# Patient Record
Sex: Female | Born: 1935 | Race: White | Hispanic: No | Marital: Married | Smoking: Never smoker
Health system: Southern US, Community
[De-identification: ages and names within clinical notes are randomized; demographics above are authoritative.]

## PROBLEM LIST (undated history)

## (undated) DIAGNOSIS — Z923 Personal history of irradiation: Secondary | ICD-10-CM

## (undated) DIAGNOSIS — C50919 Malignant neoplasm of unspecified site of unspecified female breast: Secondary | ICD-10-CM

## (undated) DIAGNOSIS — C55 Malignant neoplasm of uterus, part unspecified: Secondary | ICD-10-CM

## (undated) HISTORY — PX: EXCISION / BIOPSY BREAST / NIPPLE / DUCT: SUR469

---

## 2004-11-02 ENCOUNTER — Ambulatory Visit: Payer: Self-pay | Admitting: Internal Medicine

## 2004-12-24 ENCOUNTER — Ambulatory Visit: Payer: Self-pay | Admitting: Family Medicine

## 2004-12-30 ENCOUNTER — Ambulatory Visit: Payer: Self-pay | Admitting: Family Medicine

## 2006-01-04 ENCOUNTER — Ambulatory Visit: Payer: Self-pay | Admitting: Family Medicine

## 2006-01-11 ENCOUNTER — Ambulatory Visit: Payer: Self-pay | Admitting: Family Medicine

## 2006-07-29 ENCOUNTER — Ambulatory Visit: Payer: Self-pay | Admitting: General Surgery

## 2006-08-09 ENCOUNTER — Ambulatory Visit: Payer: Self-pay | Admitting: General Surgery

## 2007-01-05 ENCOUNTER — Ambulatory Visit: Payer: Self-pay | Admitting: Family Medicine

## 2007-03-21 ENCOUNTER — Other Ambulatory Visit: Payer: Self-pay

## 2007-03-21 ENCOUNTER — Ambulatory Visit: Payer: Self-pay | Admitting: General Surgery

## 2007-03-27 ENCOUNTER — Ambulatory Visit: Payer: Self-pay | Admitting: General Surgery

## 2007-11-22 ENCOUNTER — Ambulatory Visit: Payer: Self-pay | Admitting: Family Medicine

## 2007-12-14 ENCOUNTER — Ambulatory Visit: Payer: Self-pay | Admitting: General Surgery

## 2008-01-09 ENCOUNTER — Ambulatory Visit: Payer: Self-pay | Admitting: Family Medicine

## 2008-09-17 ENCOUNTER — Emergency Department: Payer: Self-pay | Admitting: Emergency Medicine

## 2008-12-06 DIAGNOSIS — C55 Malignant neoplasm of uterus, part unspecified: Secondary | ICD-10-CM

## 2008-12-06 HISTORY — DX: Malignant neoplasm of uterus, part unspecified: C55

## 2009-01-16 ENCOUNTER — Ambulatory Visit: Payer: Self-pay | Admitting: Internal Medicine

## 2009-01-21 ENCOUNTER — Ambulatory Visit: Payer: Self-pay | Admitting: Internal Medicine

## 2009-03-05 ENCOUNTER — Ambulatory Visit: Payer: Self-pay | Admitting: Internal Medicine

## 2009-04-18 ENCOUNTER — Ambulatory Visit: Payer: Self-pay | Admitting: Surgery

## 2009-04-18 ENCOUNTER — Ambulatory Visit: Payer: Self-pay | Admitting: Cardiology

## 2009-04-28 ENCOUNTER — Ambulatory Visit: Payer: Self-pay | Admitting: Surgery

## 2010-01-28 ENCOUNTER — Ambulatory Visit: Payer: Self-pay | Admitting: Internal Medicine

## 2010-02-16 ENCOUNTER — Ambulatory Visit: Payer: Self-pay | Admitting: Gastroenterology

## 2010-02-18 ENCOUNTER — Ambulatory Visit: Payer: Self-pay | Admitting: Gastroenterology

## 2011-02-03 ENCOUNTER — Ambulatory Visit: Payer: Self-pay | Admitting: Internal Medicine

## 2011-02-24 ENCOUNTER — Ambulatory Visit: Payer: Self-pay | Admitting: Internal Medicine

## 2011-12-07 DIAGNOSIS — C50919 Malignant neoplasm of unspecified site of unspecified female breast: Secondary | ICD-10-CM

## 2011-12-07 HISTORY — PX: BREAST LUMPECTOMY: SHX2

## 2011-12-07 HISTORY — PX: BREAST BIOPSY: SHX20

## 2011-12-07 HISTORY — DX: Malignant neoplasm of unspecified site of unspecified female breast: C50.919

## 2012-03-02 ENCOUNTER — Ambulatory Visit: Payer: Self-pay | Admitting: Internal Medicine

## 2012-03-15 ENCOUNTER — Ambulatory Visit: Payer: Self-pay | Admitting: Internal Medicine

## 2012-04-11 ENCOUNTER — Ambulatory Visit: Payer: Self-pay | Admitting: Surgery

## 2012-04-19 LAB — PATHOLOGY REPORT

## 2012-04-25 ENCOUNTER — Ambulatory Visit: Payer: Self-pay | Admitting: Surgery

## 2012-04-25 DIAGNOSIS — I10 Essential (primary) hypertension: Secondary | ICD-10-CM

## 2012-04-25 LAB — CBC
HCT: 42 %
HGB: 13.9 g/dL
MCH: 30.7 pg
MCHC: 33.1 g/dL
MCV: 93 fL
Platelet: 113 10*3/uL — ABNORMAL LOW
RBC: 4.54 X10 6/mm 3
RDW: 13.5 %
WBC: 5.2 10*3/uL

## 2012-05-02 ENCOUNTER — Ambulatory Visit: Payer: Self-pay | Admitting: Surgery

## 2012-05-16 ENCOUNTER — Ambulatory Visit: Payer: Self-pay | Admitting: Internal Medicine

## 2012-06-05 ENCOUNTER — Ambulatory Visit: Payer: Self-pay | Admitting: Internal Medicine

## 2012-06-05 ENCOUNTER — Ambulatory Visit: Payer: Self-pay | Admitting: Surgery

## 2012-06-06 ENCOUNTER — Ambulatory Visit: Payer: Self-pay | Admitting: Internal Medicine

## 2012-07-06 ENCOUNTER — Ambulatory Visit: Payer: Self-pay | Admitting: Internal Medicine

## 2012-08-06 ENCOUNTER — Ambulatory Visit: Payer: Self-pay | Admitting: Internal Medicine

## 2012-08-07 IMAGING — US TRANSABDOMINAL ULTRASOUND OF PELVIS
1 series · 17 of 25 positions shown · non-contrast
Comparison: none

REASON FOR EXAM: Left Adnexal Pain
COMMENTS:

[Series 1: transabdominal ultrasound of pelvis · 17 of 40 slices shown]
[im 1/40]
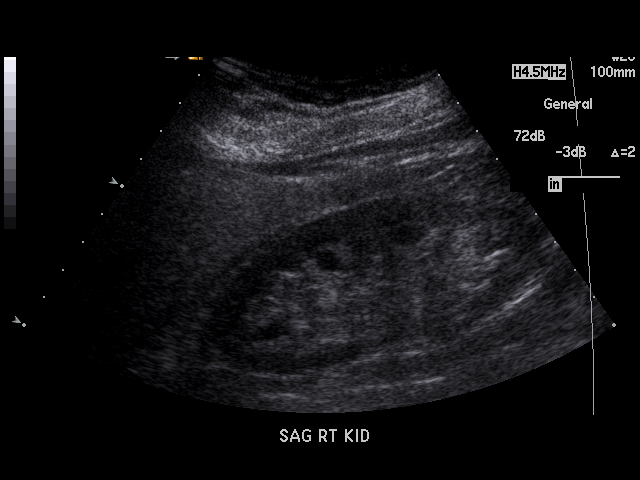
[im 4/40]
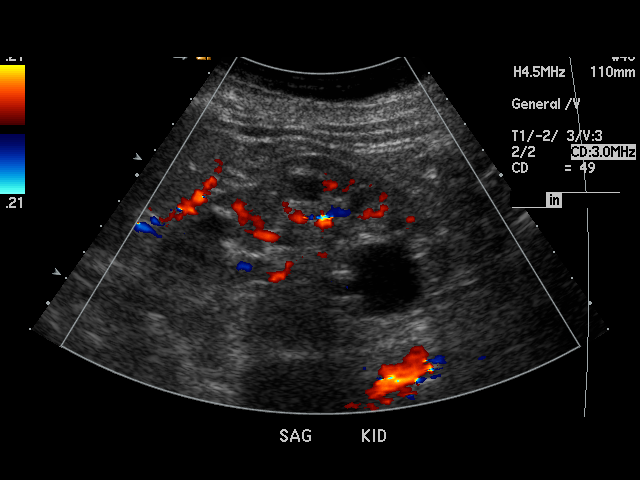
[im 5/40]
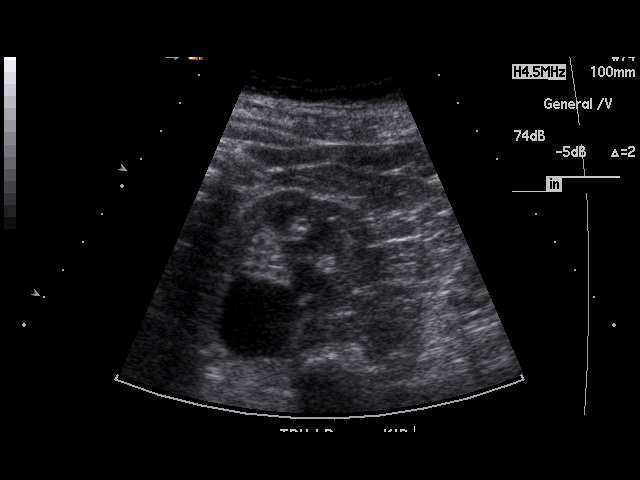
[im 9/40]
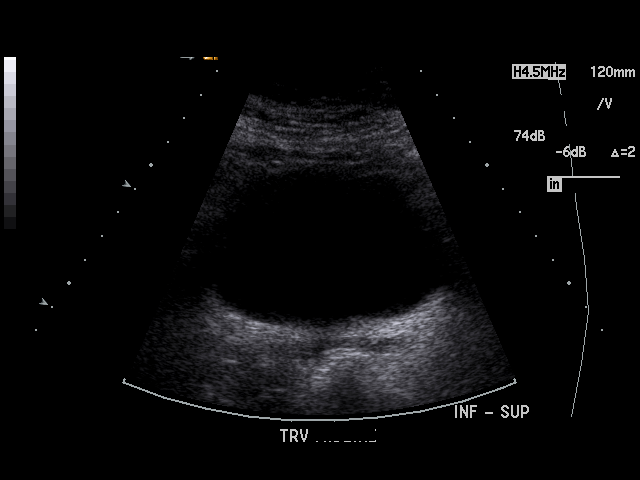
[im 10/40]
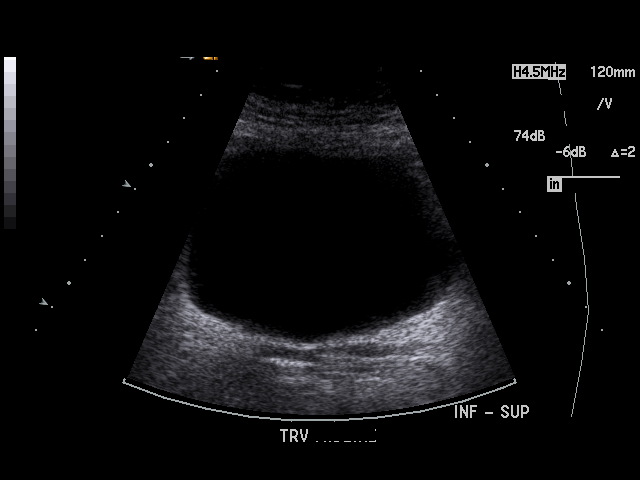
[im 14/40]
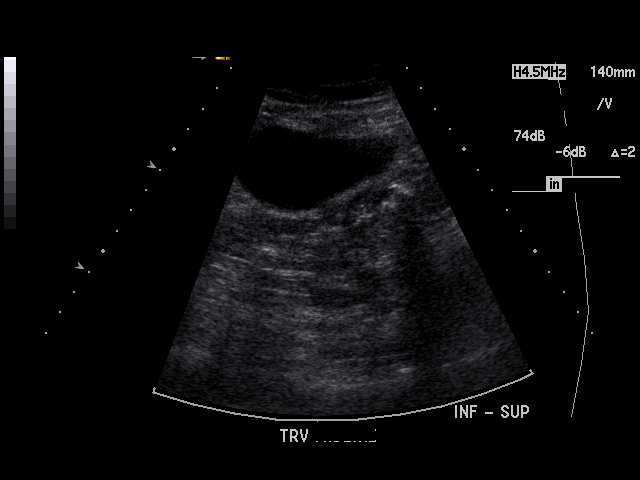
[im 15/40]
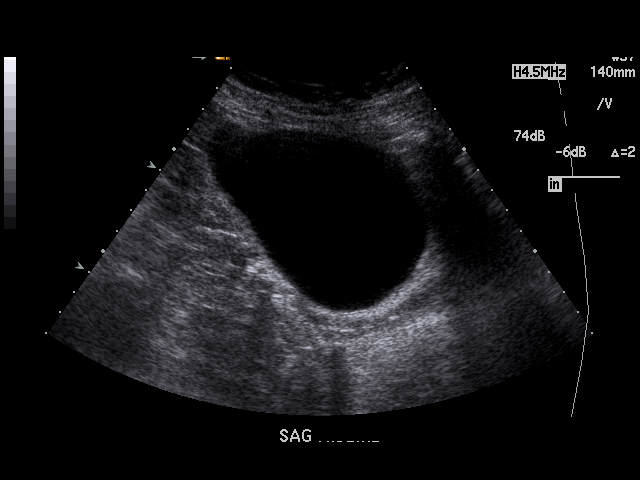
[im 18/40]
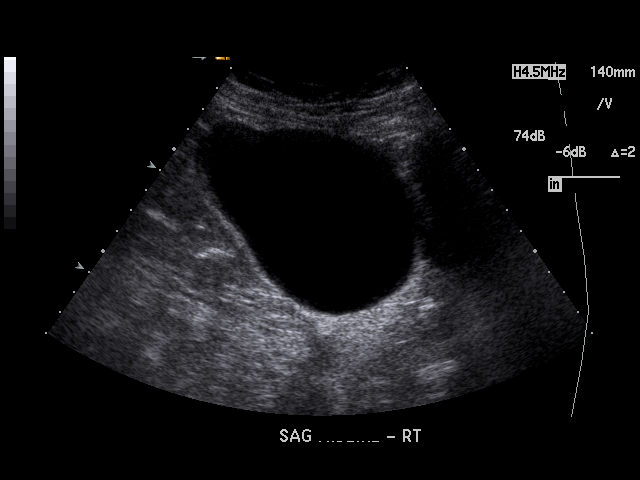
[im 20/40]
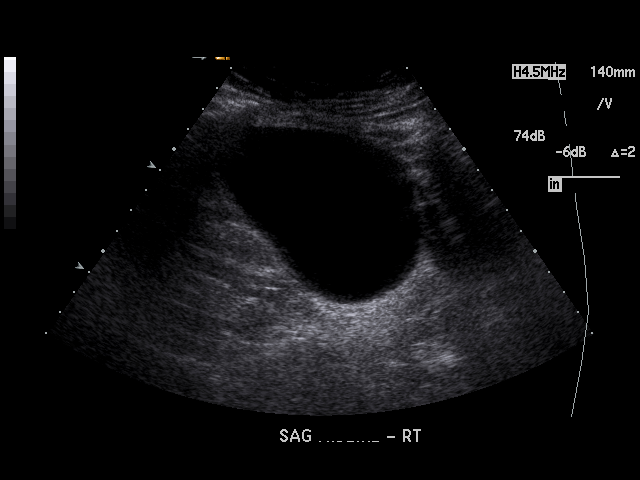
[im 22/40]
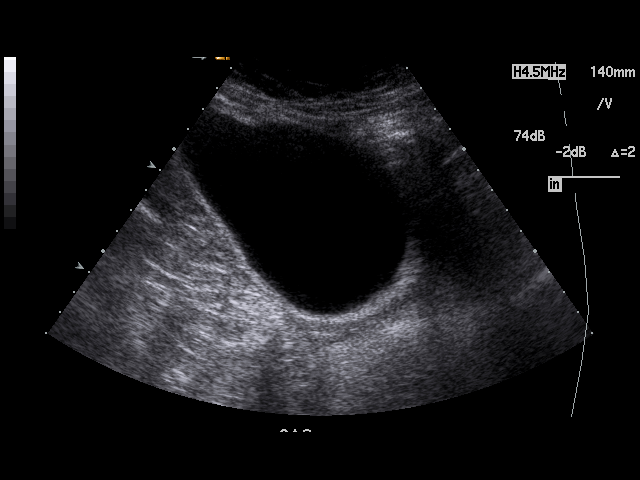
[im 25/40]
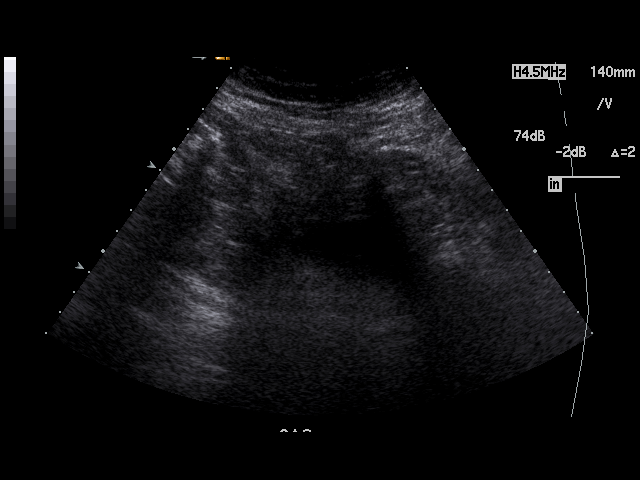
[im 27/40]
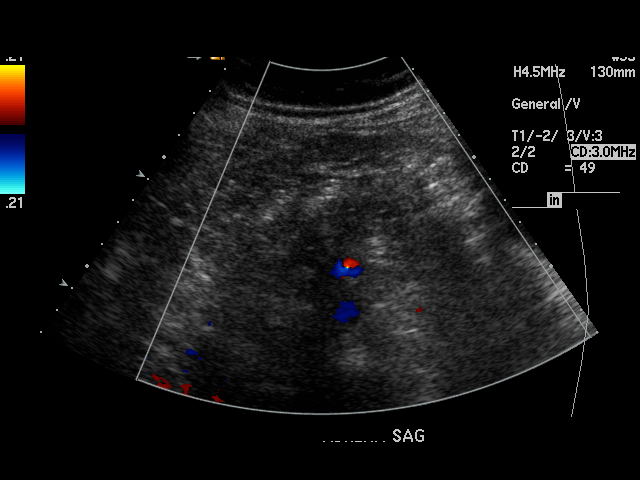
[im 30/40]
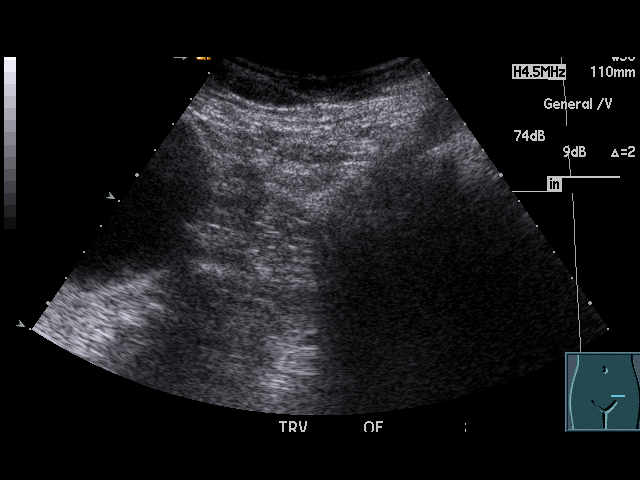
[im 31/40]
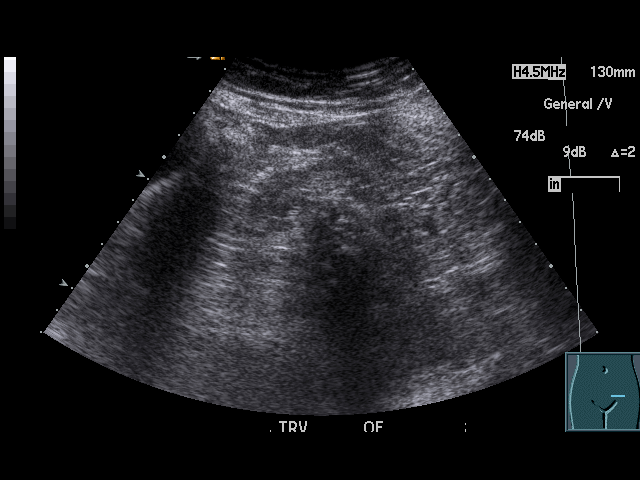
[im 35/40]
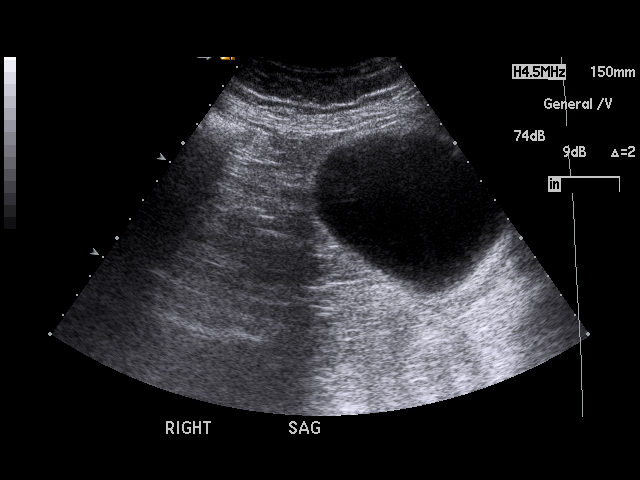
[im 36/40]
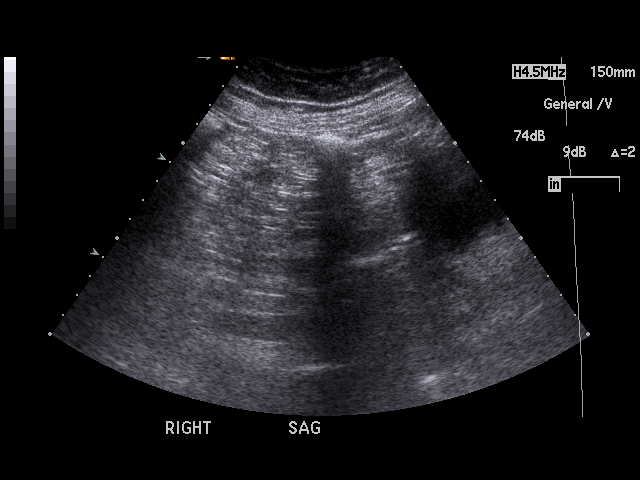
[im 40/40]
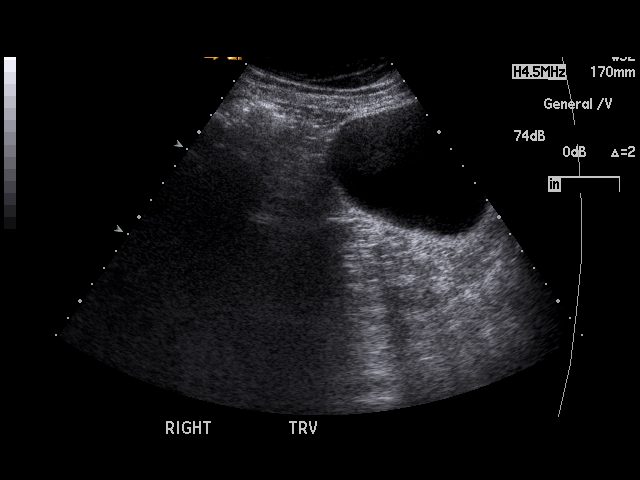

[17 of 25 positions shown; findings below may reference images not displayed]

PROCEDURE:     KIDUL - KIDUL PELVIS NON-OB  - February 03, 2011 [DATE]

RESULT:     Transabdominal Pelvic Ultrasound was performed. The visualized
portion of the urinary bladder is normal in appearance. The uterus is not
seen compatible with prior hysterectomy. The ovaries also are not seen
compatible with prior bilateral oophorectomies. No abnormal adnexal masses
are seen. No free fluid is noted in the pelvis. The kidneys show no
hydronephrosis. There is a cyst at the lower pole of the left kidney. The
cyst was not measured but appears to be approximately 2 to 2.5 cm at maximum
diameter. No ascites is seen.
IMPRESSION: 1.  The patient is status post hysterectomy and bilateral oophorectomies.
2.  No abnormal adnexal masses are seen.
3.  No free fluid is noted in the pelvis.
4.  Incidental note is made of a left renal cyst.

## 2012-11-14 ENCOUNTER — Ambulatory Visit: Payer: Self-pay | Admitting: Oncology

## 2012-11-14 LAB — COMPREHENSIVE METABOLIC PANEL
Albumin: 3.8 g/dL (ref 3.4–5.0)
Alkaline Phosphatase: 63 U/L (ref 50–136)
Co2: 28 mmol/L (ref 21–32)
Creatinine: 1.27 mg/dL (ref 0.60–1.30)
EGFR (African American): 46 — ABNORMAL LOW
Osmolality: 284 (ref 275–301)
Potassium: 3.9 mmol/L (ref 3.5–5.1)
SGOT(AST): 18 U/L (ref 15–37)
SGPT (ALT): 25 U/L (ref 12–78)
Sodium: 141 mmol/L (ref 136–145)
Total Protein: 6.8 g/dL (ref 6.4–8.2)

## 2012-11-14 LAB — CBC CANCER CENTER
Basophil %: 1.2 %
Eosinophil %: 2.5 %
HGB: 14.2 g/dL (ref 12.0–16.0)
Lymphocyte %: 34.9 %
MCH: 31.7 pg (ref 26.0–34.0)
MCHC: 34.9 g/dL (ref 32.0–36.0)
Monocyte #: 0.5 x10 3/mm (ref 0.2–0.9)
Neutrophil %: 52.8 %
RBC: 4.49 10*6/uL (ref 3.80–5.20)

## 2012-11-15 LAB — CANCER ANTIGEN 27.29: CA 27.29: 17.4 U/mL (ref 0.0–38.6)

## 2012-12-06 ENCOUNTER — Ambulatory Visit: Payer: Self-pay | Admitting: Oncology

## 2013-03-05 ENCOUNTER — Ambulatory Visit: Payer: Self-pay | Admitting: Oncology

## 2013-05-14 ENCOUNTER — Ambulatory Visit: Payer: Self-pay | Admitting: Oncology

## 2013-05-15 LAB — COMPREHENSIVE METABOLIC PANEL
Albumin: 3.8 g/dL (ref 3.4–5.0)
BUN: 25 mg/dL — ABNORMAL HIGH (ref 7–18)
Calcium, Total: 9.3 mg/dL (ref 8.5–10.1)
Co2: 28 mmol/L (ref 21–32)
Creatinine: 1.21 mg/dL (ref 0.60–1.30)
EGFR (African American): 49 — ABNORMAL LOW
EGFR (Non-African Amer.): 42 — ABNORMAL LOW
Osmolality: 286 (ref 275–301)
Potassium: 4.1 mmol/L (ref 3.5–5.1)
SGOT(AST): 14 U/L — ABNORMAL LOW (ref 15–37)
Total Protein: 6.9 g/dL (ref 6.4–8.2)

## 2013-05-15 LAB — CBC CANCER CENTER
Eosinophil %: 2.4 %
HCT: 40.1 % (ref 35.0–47.0)
HGB: 13.9 g/dL (ref 12.0–16.0)
Lymphocyte %: 36.8 %
MCH: 31.4 pg (ref 26.0–34.0)
MCHC: 34.7 g/dL (ref 32.0–36.0)
MCV: 91 fL (ref 80–100)
Neutrophil #: 3 x10 3/mm (ref 1.4–6.5)
Platelet: 120 x10 3/mm — ABNORMAL LOW (ref 150–440)
RBC: 4.43 10*6/uL (ref 3.80–5.20)
RDW: 13.8 % (ref 11.5–14.5)
WBC: 5.8 x10 3/mm (ref 3.6–11.0)

## 2013-06-05 ENCOUNTER — Ambulatory Visit: Payer: Self-pay | Admitting: Oncology

## 2013-09-11 ENCOUNTER — Ambulatory Visit: Payer: Self-pay | Admitting: Gastroenterology

## 2013-09-25 ENCOUNTER — Ambulatory Visit: Payer: Self-pay | Admitting: Gastroenterology

## 2013-09-26 LAB — PATHOLOGY REPORT

## 2013-10-14 IMAGING — US US  BREAST BX W/ LOC DEV 1ST LESION IMG BX SPEC US GUIDE*R*
1 series · 17 of 25 positions shown · non-contrast
Comparison: none

REASON FOR EXAM: rt breast mass
COMMENTS:

[Series 1: us breast bx w/ loc dev 1st lesion img bx spec us  · 17 of 54 slices shown]
[im 1/54]
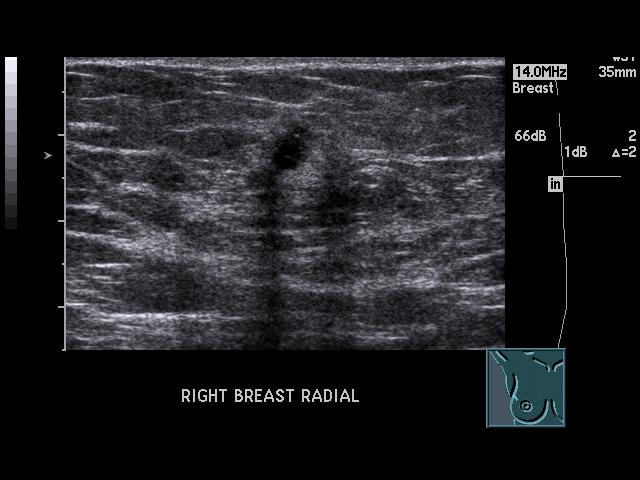
[im 5/54]
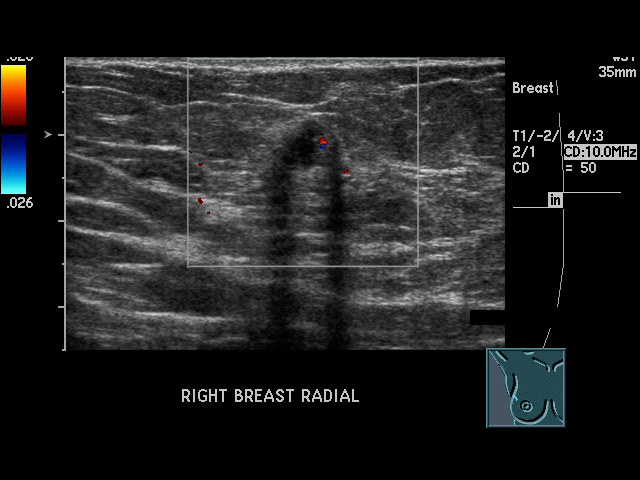
[im 7/54]
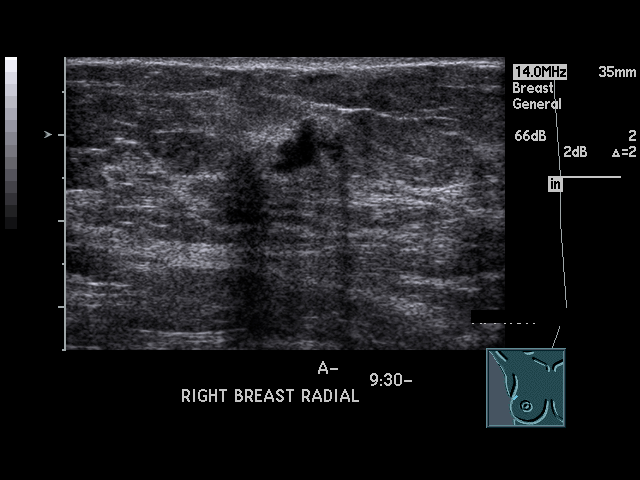
[im 12/54]
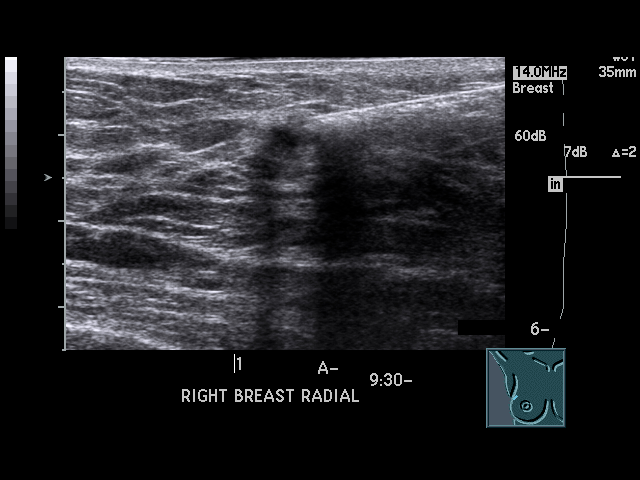
[im 14/54]
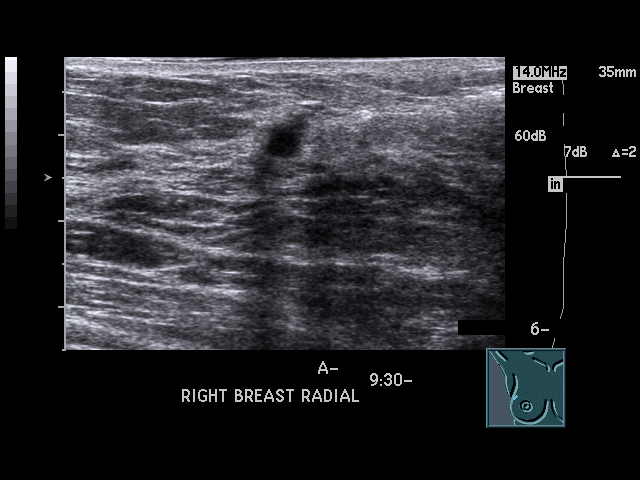
[im 18/54]
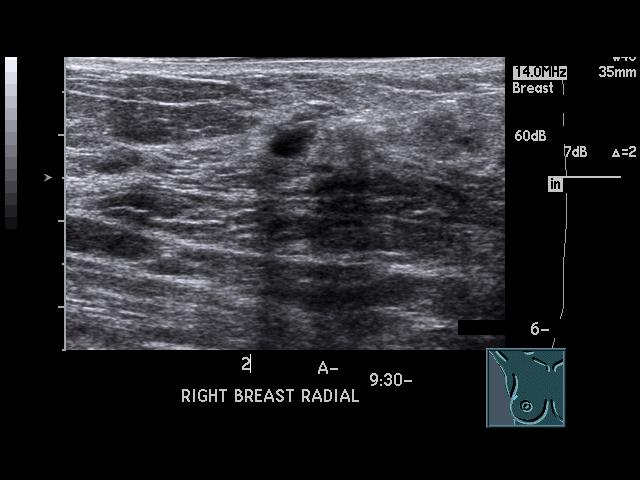
[im 20/54]
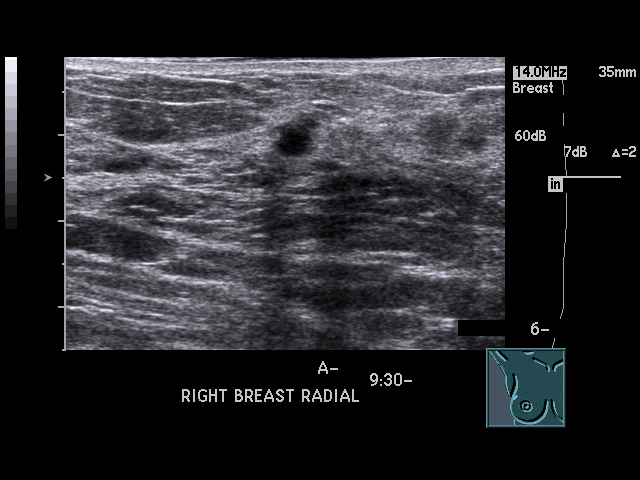
[im 25/54]
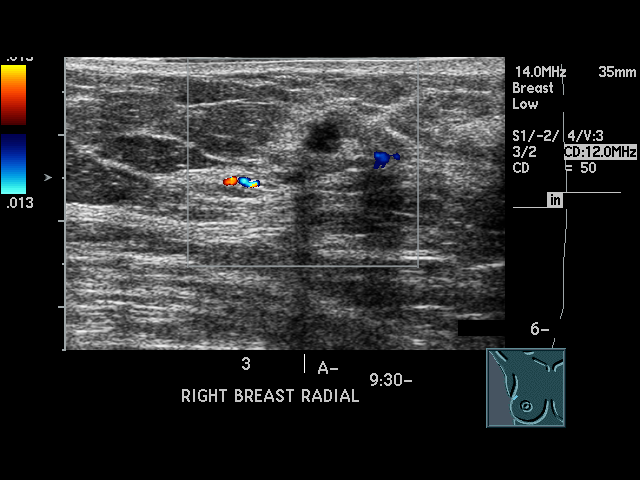
[im 27/54]
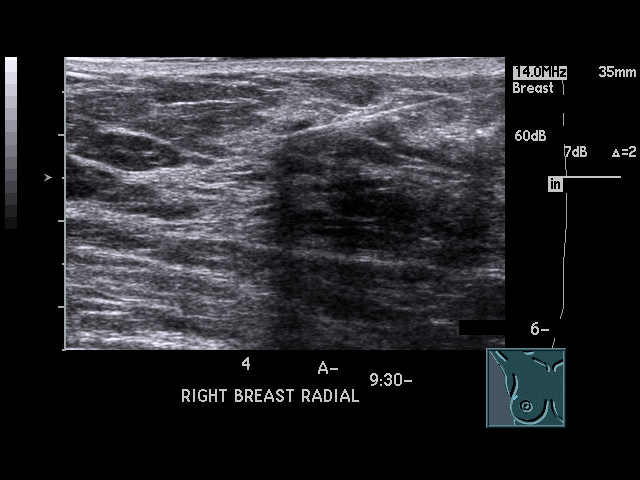
[im 29/54]
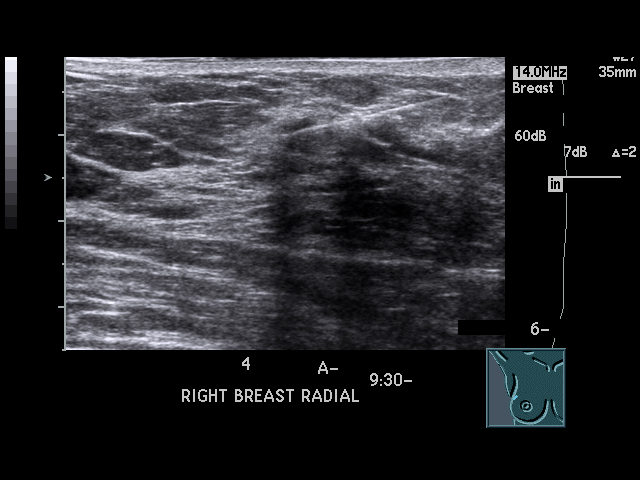
[im 34/54]
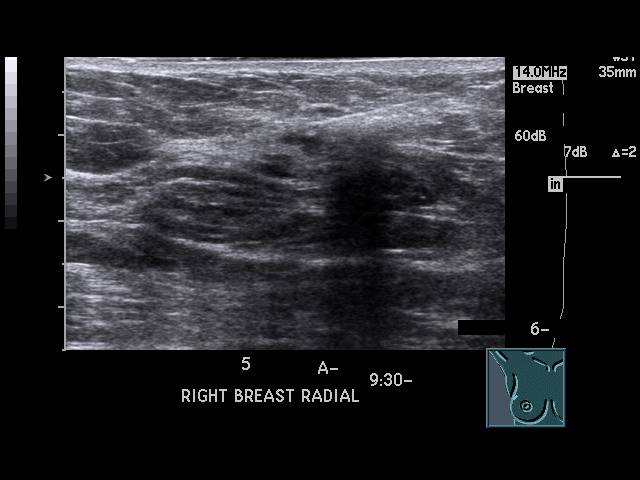
[im 36/54]
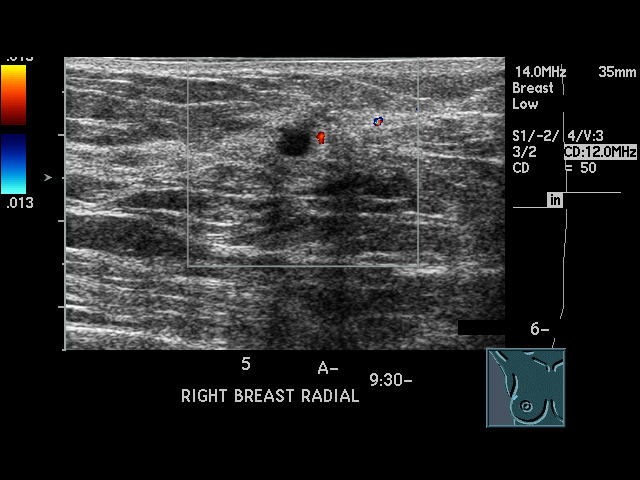
[im 40/54]
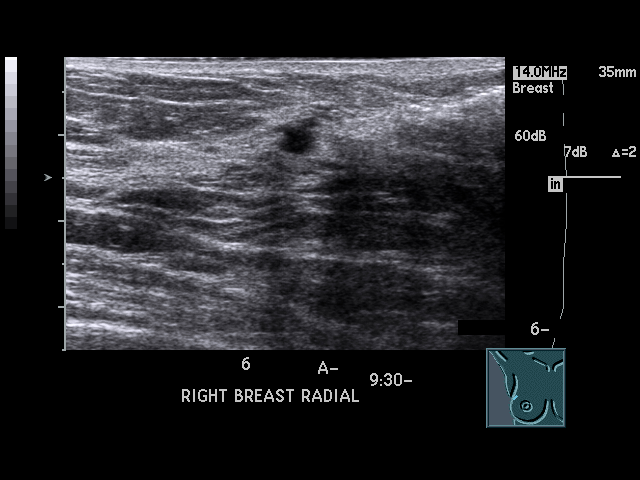
[im 42/54]
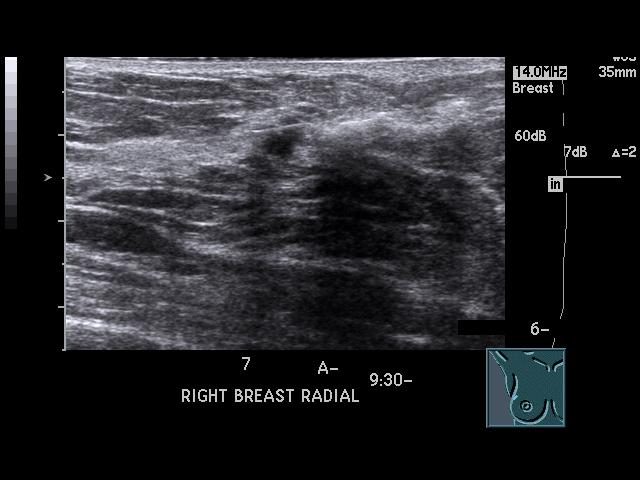
[im 47/54]
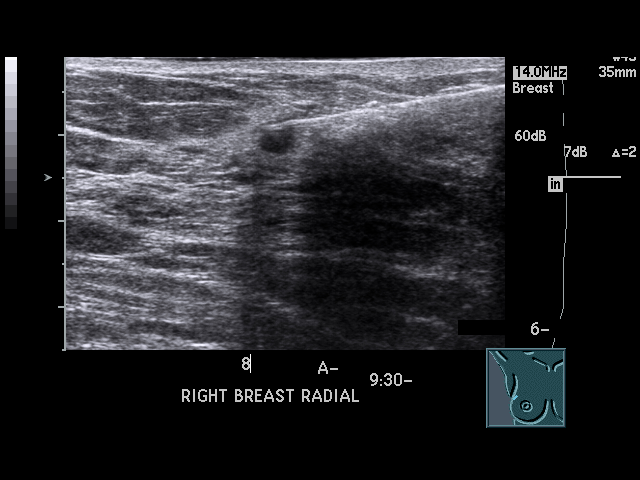
[im 49/54]
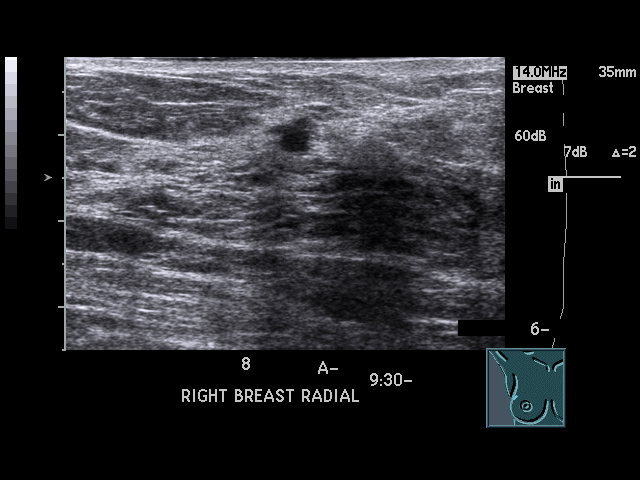
[im 54/54]
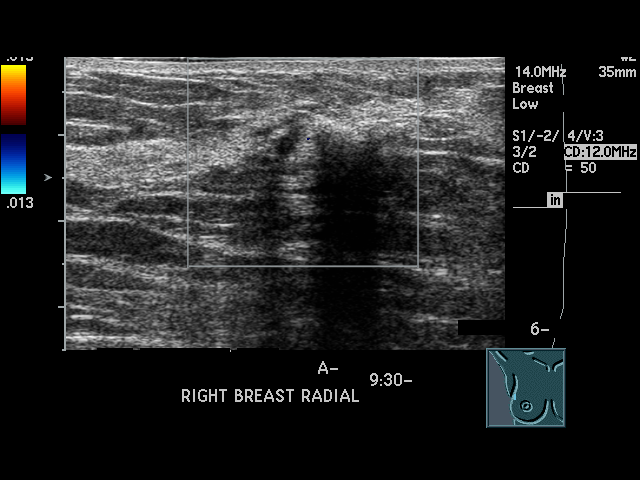

[17 of 25 positions shown; findings below may reference images not displayed]

PROCEDURE:     US  - US GUIDED BIOPSY BREAST RIGHT  - April 11, 2012  [DATE]

RESULT:

PROCEDURE:  The patient was informed of the risks and benefits of the
procedure and proper informed consent was obtained. The patient was brought
to ultrasound suite and placed in a supine position. An irregularly
bordered, hypoechoic to anechoic nodule is identified in the 10 o'clock
position within the right breast with a component of acoustic shadowing.
Proper entry site for ultrasound guided biopsy was established. The
overlying soft tissues were then prepped and draped in the usual sterile
fashion. Utilizing 5 ml of 1% Lidocaine without epinephrine, the overlying
soft tissues were anesthetized. A small dermatotomy was formed at the entry
site. Utilizing ultrasound guidance, eight passes were made into the
hypoechoic nodule. The samples were placed in formalin and sent to pathology
for analysis. Status post biopsy there is no evidence of active hemorrhage
or perilesional fluid collections to suggest hematoma. The patient tolerated
the procedure without complications. The patient was then monitored and
discharged home.
IMPRESSION: Ultrasound guided breast biopsy as described above. The
patient tolerated the procedure without complications.

## 2013-11-13 ENCOUNTER — Ambulatory Visit: Payer: Self-pay | Admitting: Oncology

## 2013-11-13 LAB — COMPREHENSIVE METABOLIC PANEL
Albumin: 3.7 g/dL (ref 3.4–5.0)
Anion Gap: 10 (ref 7–16)
BUN: 24 mg/dL — ABNORMAL HIGH (ref 7–18)
Bilirubin,Total: 0.3 mg/dL (ref 0.2–1.0)
Chloride: 103 mmol/L (ref 98–107)
EGFR (African American): 49 — ABNORMAL LOW
EGFR (Non-African Amer.): 42 — ABNORMAL LOW
Glucose: 123 mg/dL — ABNORMAL HIGH (ref 65–99)
Potassium: 3.8 mmol/L (ref 3.5–5.1)
SGOT(AST): 17 U/L (ref 15–37)
SGPT (ALT): 23 U/L (ref 12–78)
Sodium: 141 mmol/L (ref 136–145)
Total Protein: 6.7 g/dL (ref 6.4–8.2)

## 2013-11-13 LAB — CBC CANCER CENTER
Basophil #: 0 x10 3/mm (ref 0.0–0.1)
Basophil %: 0.8 %
Eosinophil #: 0.1 x10 3/mm (ref 0.0–0.7)
Eosinophil %: 2 %
HCT: 42 % (ref 35.0–47.0)
HGB: 13.8 g/dL (ref 12.0–16.0)
Lymphocyte #: 1.9 x10 3/mm (ref 1.0–3.6)
MCH: 29.8 pg (ref 26.0–34.0)
MCV: 91 fL (ref 80–100)
Monocyte #: 0.5 x10 3/mm (ref 0.2–0.9)
RDW: 13.7 % (ref 11.5–14.5)
WBC: 5.6 x10 3/mm (ref 3.6–11.0)

## 2013-11-14 LAB — CANCER ANTIGEN 27.29: CA 27.29: 14.6 U/mL (ref 0.0–38.6)

## 2013-12-06 ENCOUNTER — Ambulatory Visit: Payer: Self-pay | Admitting: Oncology

## 2014-02-13 DIAGNOSIS — E7800 Pure hypercholesterolemia, unspecified: Secondary | ICD-10-CM | POA: Insufficient documentation

## 2014-02-13 DIAGNOSIS — E119 Type 2 diabetes mellitus without complications: Secondary | ICD-10-CM | POA: Insufficient documentation

## 2014-02-13 DIAGNOSIS — I10 Essential (primary) hypertension: Secondary | ICD-10-CM | POA: Insufficient documentation

## 2014-02-13 DIAGNOSIS — K439 Ventral hernia without obstruction or gangrene: Secondary | ICD-10-CM | POA: Insufficient documentation

## 2014-03-07 ENCOUNTER — Ambulatory Visit: Payer: Self-pay | Admitting: Oncology

## 2014-06-18 ENCOUNTER — Ambulatory Visit: Payer: Self-pay | Admitting: Oncology

## 2014-06-18 LAB — COMPREHENSIVE METABOLIC PANEL
ALBUMIN: 3.8 g/dL (ref 3.4–5.0)
ALK PHOS: 55 U/L
ALT: 22 U/L (ref 12–78)
ANION GAP: 10 (ref 7–16)
BILIRUBIN TOTAL: 0.4 mg/dL (ref 0.2–1.0)
BUN: 19 mg/dL — AB (ref 7–18)
CHLORIDE: 106 mmol/L (ref 98–107)
CREATININE: 1.13 mg/dL (ref 0.60–1.30)
Calcium, Total: 9.6 mg/dL (ref 8.5–10.1)
Co2: 27 mmol/L (ref 21–32)
EGFR (African American): 52 — ABNORMAL LOW
GFR CALC NON AF AMER: 45 — AB
Glucose: 119 mg/dL — ABNORMAL HIGH (ref 65–99)
OSMOLALITY: 288 (ref 275–301)
Potassium: 4.2 mmol/L (ref 3.5–5.1)
SGOT(AST): 18 U/L (ref 15–37)
Sodium: 143 mmol/L (ref 136–145)
TOTAL PROTEIN: 7 g/dL (ref 6.4–8.2)

## 2014-06-18 LAB — CBC CANCER CENTER
Basophil #: 0.1 x10 3/mm (ref 0.0–0.1)
Basophil %: 1.1 %
Eosinophil #: 0.2 x10 3/mm (ref 0.0–0.7)
Eosinophil %: 3.2 %
HCT: 43.2 % (ref 35.0–47.0)
HGB: 14.3 g/dL (ref 12.0–16.0)
LYMPHS PCT: 33.1 %
Lymphocyte #: 1.7 x10 3/mm (ref 1.0–3.6)
MCH: 29.9 pg (ref 26.0–34.0)
MCHC: 33.1 g/dL (ref 32.0–36.0)
MCV: 90 fL (ref 80–100)
MONOS PCT: 8.2 %
Monocyte #: 0.4 x10 3/mm (ref 0.2–0.9)
NEUTROS ABS: 2.9 x10 3/mm (ref 1.4–6.5)
NEUTROS PCT: 54.4 %
PLATELETS: 95 x10 3/mm — AB (ref 150–440)
RBC: 4.79 10*6/uL (ref 3.80–5.20)
RDW: 14.1 % (ref 11.5–14.5)
WBC: 5.2 x10 3/mm (ref 3.6–11.0)

## 2014-07-06 ENCOUNTER — Ambulatory Visit: Payer: Self-pay | Admitting: Oncology

## 2014-09-19 DIAGNOSIS — M8580 Other specified disorders of bone density and structure, unspecified site: Secondary | ICD-10-CM | POA: Insufficient documentation

## 2014-11-06 ENCOUNTER — Ambulatory Visit: Payer: Self-pay | Admitting: Oncology

## 2014-12-06 ENCOUNTER — Ambulatory Visit: Payer: Self-pay | Admitting: Oncology

## 2014-12-23 ENCOUNTER — Ambulatory Visit: Payer: Self-pay | Admitting: Oncology

## 2014-12-24 LAB — CBC CANCER CENTER
BASOS ABS: 0 x10 3/mm (ref 0.0–0.1)
Basophil %: 0.8 %
EOS PCT: 2.5 %
Eosinophil #: 0.1 x10 3/mm (ref 0.0–0.7)
HCT: 41.9 % (ref 35.0–47.0)
HGB: 13.8 g/dL (ref 12.0–16.0)
LYMPHS ABS: 1.9 x10 3/mm (ref 1.0–3.6)
Lymphocyte %: 38.5 %
MCH: 30.1 pg (ref 26.0–34.0)
MCHC: 33 g/dL (ref 32.0–36.0)
MCV: 91 fL (ref 80–100)
MONO ABS: 0.5 x10 3/mm (ref 0.2–0.9)
MONOS PCT: 9.7 %
NEUTROS ABS: 2.3 x10 3/mm (ref 1.4–6.5)
Neutrophil %: 48.5 %
Platelet: 125 x10 3/mm — ABNORMAL LOW (ref 150–440)
RBC: 4.6 10*6/uL (ref 3.80–5.20)
RDW: 13.3 % (ref 11.5–14.5)
WBC: 4.8 x10 3/mm (ref 3.6–11.0)

## 2014-12-24 LAB — COMPREHENSIVE METABOLIC PANEL
ALK PHOS: 59 U/L
Albumin: 3.7 g/dL (ref 3.4–5.0)
Anion Gap: 8 (ref 7–16)
BILIRUBIN TOTAL: 0.4 mg/dL (ref 0.2–1.0)
BUN: 20 mg/dL — AB (ref 7–18)
CO2: 30 mmol/L (ref 21–32)
CREATININE: 1.07 mg/dL (ref 0.60–1.30)
Calcium, Total: 9.1 mg/dL (ref 8.5–10.1)
Chloride: 104 mmol/L (ref 98–107)
EGFR (African American): >60
EGFR (Non-African Amer.): 52 — ABNORMAL LOW
Glucose: 88 mg/dL (ref 65–99)
OSMOLALITY: 285 (ref 275–301)
Potassium: 4.2 mmol/L (ref 3.5–5.1)
SGOT(AST): 12 U/L — ABNORMAL LOW (ref 15–37)
SGPT (ALT): 20 U/L
Sodium: 142 mmol/L (ref 136–145)
TOTAL PROTEIN: 6.5 g/dL (ref 6.4–8.2)

## 2015-01-06 ENCOUNTER — Ambulatory Visit: Payer: Self-pay | Admitting: Oncology

## 2015-03-11 ENCOUNTER — Ambulatory Visit
Admit: 2015-03-11 | Disposition: A | Discharge status: Home / Self Care | Payer: Self-pay | Attending: Oncology | Admitting: Oncology

## 2015-03-19 ENCOUNTER — Ambulatory Visit
Admit: 2015-03-19 | Disposition: A | Discharge status: Home / Self Care | Payer: Self-pay | Attending: Oncology | Admitting: Oncology

## 2015-03-19 HISTORY — PX: BREAST BIOPSY: SHX20

## 2015-03-30 NOTE — Op Note (Signed)
PATIENT NAME:  Brittney Chaney, Brittney Chaney MR#:  [id number] DATE OF BIRTH:  08-25-1929  DATE OF PROCEDURE:  05/02/2012  PREOPERATIVE DIAGNOSIS: Carcinoma of the right breast.   POSTOPERATIVE DIAGNOSIS: Carcinoma of the right breast.   PROCEDURE: Right partial mastectomy with axillary sentinel lymph node biopsy.   SURGEON: Rochel Brome, MD  ANESTHESIA: General.   INDICATIONS: This 81 year old female recently had an abnormal mammogram which demonstrated a density in the right breast and on ultrasound this appeared to be at the 9:30 to 10 o'clock position. She had a core needle biopsy which demonstrated an infiltrating cancer and surgery was recommended for definitive treatment. She did have preoperative injection of radioactive technetium sulfur colloid. She had Kopans wire needle localization with ultrasound and also follow-up mammograms. I have reviewed those ultrasounds and mammograms.  DESCRIPTION OF PROCEDURE: The patient was placed on the operating table in the supine position under general anesthesia. The right arm was placed on a lateral arm rest. The dressing was removed from the lateral aspect of the right breast exposing the Kopans wire which was cut 3 cm from the skin. The breast was prepared with ChloraPrep and draped in a sterile manner.   The gamma counter was used in the inferior aspect of the axilla demonstrating some minimal degree of radioactivity. An obliquely oriented incision was made, extended from the border of the pectoralis major muscle posteriorly, approximately 5 cm in length, and carried down through subcutaneous tissues. Several small bleeding points were cauterized. Dissection was carried down to the superficial fascia which was incised. There was some radioactivity found in the inferior aspect of the axilla and followed this down deep within the axilla adjacent to the rib cage and encountered a lymph node which had radioactivity. This was palpable and was dissected free from  surrounding structures along with some surrounding fatty tissue. It was dissected away from surrounding tissues with use of electrocautery. The ex vivo count was in the range of 5 to 15 counts per second. The specimen was submitted for frozen section. The background count was in the range of 0 to 3. The wound was further examined. There was no palpable mass found remaining in the wound. Several small bleeding points were cauterized. Hemostasis was subsequently intact.   Attention was turned to do the right partial mastectomy. The Kopans wire was noted at approximately the 9:30 position and extended medially. A curvilinear incision was made in the upper outer quadrant of the right breast from approximately 9 o'clock  to the 11 o'clock position and carried down through subcutaneous tissues and could begin to feel some firmness within the breast and breast tissue surrounding this firm area and surrounding the wire was excised. The portion of tissue excised was approximately 5 x 5 x 5 cm in dimension and was labeled so that the 11 o'clock end of the skin ellipse was tagged with a stitch and also margin maps were sutured to the specimen. It was sent for specimen mammogram. The radiologist called back to report the biopsy marker was found on the specimen mammogram. It was then sent to the lab.  During the course of surgery, the pathologist called twice, first to report the sentinel lymph node appeared to be free of tumor and towards the end to report that the margins appeared clear.   It is further noted that the axillary wound was inspected and hemostasis was intact. The wound was closed with a running 5-0 Monocryl subcuticular suture. The partial mastectomy wound was inspected.  One clamped vessel was suture ligated with 4-0 chromic. Multiple small bleeding points were cauterized. Hemostasis was subsequently found to be intact. The wound was closed with running 5-0 Monocryl subcuticular suture.   Both wounds were  treated with Dermabond and, after the pathologist called, the patient was subsequently awoken and prepared for transfer to the recovery room. ____________________________ Lenna Sciara. Rochel Brome, MD jws:slb D: 05/02/2012 14:41:42 ET T: 05/02/2012 16:28:30 ET JOB#: [id number]  cc: Loreli Dollar, MD, <Dictator> Loreli Dollar MD ELECTRONICALLY SIGNED 05/03/2012 11:06

## 2015-03-30 NOTE — Consult Note (Signed)
Reason for Visit: This 81 year old Female patient presents to the clinic for initial evaluation of .   Referred by Dr. Tamala Julian.  Diagnosis:   Chief Complaint/Diagnosis   81 year old female status post wide local excision and sentinel node biopsy for a pathologic stage I (T1 C. N0 M0 invasive carcinoma and invasive lobular carcinoma.   Pathology Report Pathology report reviewed    Imaging Report Mammograms ultrasound reviewed    Referral Report Clinical notes reviewed    Planned Treatment Regimen Possibly accelerated partial breast irradiation versus observation    HPI   patient is an 81 year old female who presents with an abnormal mammogram of her right breast showing an new density in the upper outer quadrant. core biopsy was positive for invasive mammary carcinoma. She underwent a wide local excision and sentinel node biopsy. Tumor was 1.5 x 1.2 cm. Overall grade was 1. Margins were clear. 2 sentinel lymph nodes were negative for metastatic disease. Lobular carcinoma in situ was present. Tumor was strongly ER/PR positive HER-2/neu negative. Patient has done well postoperatively. She seen today by medical oncology and is now referred to radiation oncology for opinion. She is having no breast tenderness at this time. Her incision is healing well.  Past Hx:    uterine cancer:    Hypercholesterolemia:    HTN:    Mastectomy - right:    Hysterectomy - Total:    Dilation and Curretage:    Hemorrhoidectomy:   Past, Family and Social History:   Past Medical History positive    Cardiovascular hyperlipidemia; hypertension    Past Surgical History D&C, hemorrhoidectomy, hysterectomy, history of uterine cancer    Past Medical History Comments History of gout    Family History positive    Family History Comments Sister with breast cancer    Social History noncontributory    Additional Past Medical and Surgical History Accompanied by husband and son today.   Allergies:    NKDA: None  Home Meds:  Home Medications: Medication Instructions Status  anastrozole 1 mg oral tablet 1 tab(s) orally once a day Active  omeprazole 20 mg oral delayed release capsule 1 tab(s)  once a day  HS Active  aspirin 81 mg oral tablet 1 tab(s)  once a day  HS Active  atenolol 50 mg oral tablet 1 tab(s)  once a day  AM Active  simvastatin 65m 1 tab(s)  once a day (at bedtime)  Active  Tylenol Caplet Extra Strength 500 mg oral tablet 2 tab(s) orally every 6 hours, As Needed- for Pain  Active  Centrum Silver oral tablet 1 tab(s) orally once a day AM Active  caltrate 600 plus D  2 tab(s)  once a day Active  enalapril 20 mg oral tablet 1 tab(s) orally once a day Active   Review of Systems:   General negative    Performance Status (ECOG) 0    Skin negative    Breast see HPI    Ophthalmologic negative    ENMT negative    Respiratory and Thorax negative    Cardiovascular negative    Gastrointestinal negative    Genitourinary negative    Musculoskeletal negative    Neurological negative    Psychiatric negative    Hematology/Lymphatics negative    Endocrine negative    Allergic/Immunologic negative   Nursing Notes:  Nursing Vital Signs and Chemo Nursing Nursing Notes: *CC Vital Signs Flowsheet:   11-Jun-13 14:26   Temp Temperature 98.6   Pulse Pulse 81  Respirations Respirations 18   SBP SBP 142   DBP DBP 83   Pain Scale (0-10)  0   Current Weight (kg) (kg) 72.7   Height (cm) centimeters 165   BSA (m2) 1.7    15:56   Height (cm) centimeters 165   Physical Exam:  General/Skin/HEENT:   General normal    Skin normal    Eyes normal    ENMT normal    Head and Neck normal    Additional PE Well-developed female in NAD. Right breast has 2 incisions a wide local excision and sentinel nodes incision both healed well. No dominant mass or nodularity is noted in either breast into position examined. No axillary or supraclavicular adenopathy is  identified. Lungs are clear to A&P cardiac examination shows regular rate and rhythm.   Breasts/Resp/CV/GI/GU:   Respiratory and Thorax normal    Cardiovascular normal    Gastrointestinal normal    Genitourinary normal   MS/Neuro/Psych/Lymph:   Musculoskeletal normal    Neurological normal    Lymphatics normal   Assessment and Plan:  Impression:   stage I invasive mammary carcinoma the right breast has a wide local excision and sentinel node biopsy in 81 year old female  Plan:   at this time I have discussed adjuvant radiation therapy. Her transportation issues with the family although that would be amenable to a short course of accelerated partial breast irradiation. I have contacted Dr. Thompson Caul office to see if you MammoSite catheter can be placed. Would afterloading with a radium to 3400 cGy in 10 fractions at 340 cGy twice a day to 1 cm from the surface of the balloon. If catheter could not be placed I am comfortable with continuing to observe the patient and continue her her on an aromatase inhibitor. I have discussed the case personally with Dr. Oliva Bustard who agrees with my assessment and plan. Will wait Dr. Tamala Julian input as to if the patient can have breast irradiation and placement of MammoSite catheter.  I would like to take this opportunity to thank you for allowing me to continue to participate in this patient's care.  CC Referral:   cc: Dr. Tamala Julian, Dr. Fulton Reek   Electronic Signatures: Armstead Peaks (MD)  (Signed 11-Jun-13 16:12)  Authored: HPI, Diagnosis, Past Hx, PFSH, Allergies, Home Meds, ROS, Nursing Notes, Physical Exam, Encounter Assessment and Plan, CC Referring Physician   Last Updated: 11-Jun-13 16:12 by Armstead Peaks (MD)

## 2015-03-30 NOTE — Op Note (Signed)
PATIENT NAME:  Brittney Chaney, Brittney Chaney MR#:  [id number] DATE OF BIRTH:  Oct 31, 1929  DATE OF PROCEDURE:  06/05/2012  PREOPERATIVE DIAGNOSIS: Right breast cancer.   POSTOPERATIVE DIAGNOSIS: Right breast cancer.   PROCEDURE PERFORMED: Insertion of MammoSite catheter.  SURGEON: Rochel Brome, M.D.   ANESTHESIA: Local 1% Xylocaine with epinephrine and monitored anesthesia care.   INDICATIONS: This 81 year old female recently had a partial mastectomy due to cancer of the upper outer quadrant right breast. Radiation brachytherapy was recommended for further treatment.   PROCEDURE: The patient was placed on the operating table in the supine position. The right arm was placed on a lateral arm rest with the shoulder extended. The right breast was prepared with ChloraPrep and draped in a sterile manner.  I noted the scar in the upper outer quadrant of the right breast, which was obliquely oriented and also the axillary incision, which is more superior and lateral. There was a palpable seroma in the region of the larger scar.  This was further examined with ultrasound, which was placed into a sterile sleeve, demonstrating a seroma which was approximately 3.5 cm in maximal dimension. The skin medial to the medial end of the incision was infiltrated with Xylocaine with epinephrine. A lancing 1-cm incision was made. Next a trocar was advanced into the seroma using ultrasound guidance and aspirated serous fluid. Subsequently the trocar was removed and the MammoSite catheter inserted. Pressure was applied to the site to express the seroma and then we subsequently inflated the MammoSite catheter with 50 mL of saline and closed the wound with a single 3-0 nylon vertical mattress suture and also attached and suture to the catheter. Next the dressings were applied using gauze, benzoin, and Tegaderm.      The patient tolerated surgery satisfactorily and was then prepared for transfer to the recovery room.     ____________________________ Lenna Sciara. Rochel Brome, MD jws:bjt D: 06/05/2012 12:37:54 ET T: 06/05/2012 12:43:56 ET JOB#: [id number]  cc: Loreli Dollar, MD, <Dictator> Loreli Dollar MD ELECTRONICALLY SIGNED 06/07/2012 19:59

## 2015-03-31 LAB — SURGICAL PATHOLOGY

## 2015-06-17 ENCOUNTER — Other Ambulatory Visit: Payer: Self-pay | Admitting: Oncology

## 2015-06-20 ENCOUNTER — Other Ambulatory Visit: Payer: Self-pay | Admitting: *Deleted

## 2015-06-20 DIAGNOSIS — C50919 Malignant neoplasm of unspecified site of unspecified female breast: Secondary | ICD-10-CM

## 2015-06-24 ENCOUNTER — Inpatient Hospital Stay: Payer: Medicare Other | Attending: Oncology | Admitting: Oncology

## 2015-06-24 ENCOUNTER — Encounter: Payer: Self-pay | Admitting: Oncology

## 2015-06-24 ENCOUNTER — Inpatient Hospital Stay: Payer: Medicare Other

## 2015-06-24 VITALS — BP 158/77 | HR 61 | Temp 97.0°F | Wt 153.0 lb

## 2015-06-24 DIAGNOSIS — E780 Pure hypercholesterolemia: Secondary | ICD-10-CM | POA: Diagnosis not present

## 2015-06-24 DIAGNOSIS — Z8542 Personal history of malignant neoplasm of other parts of uterus: Secondary | ICD-10-CM

## 2015-06-24 DIAGNOSIS — I10 Essential (primary) hypertension: Secondary | ICD-10-CM | POA: Diagnosis not present

## 2015-06-24 DIAGNOSIS — C50911 Malignant neoplasm of unspecified site of right female breast: Secondary | ICD-10-CM | POA: Insufficient documentation

## 2015-06-24 DIAGNOSIS — Z170 Estrogen receptor positive status [ER+]: Secondary | ICD-10-CM | POA: Diagnosis not present

## 2015-06-24 DIAGNOSIS — Z9011 Acquired absence of right breast and nipple: Secondary | ICD-10-CM | POA: Diagnosis not present

## 2015-06-24 DIAGNOSIS — C50919 Malignant neoplasm of unspecified site of unspecified female breast: Secondary | ICD-10-CM

## 2015-06-24 DIAGNOSIS — Z853 Personal history of malignant neoplasm of breast: Secondary | ICD-10-CM | POA: Insufficient documentation

## 2015-06-24 DIAGNOSIS — Z90710 Acquired absence of both cervix and uterus: Secondary | ICD-10-CM | POA: Insufficient documentation

## 2015-06-24 DIAGNOSIS — C50411 Malignant neoplasm of upper-outer quadrant of right female breast: Secondary | ICD-10-CM | POA: Diagnosis present

## 2015-06-24 DIAGNOSIS — Z79811 Long term (current) use of aromatase inhibitors: Secondary | ICD-10-CM | POA: Insufficient documentation

## 2015-06-24 DIAGNOSIS — Z79899 Other long term (current) drug therapy: Secondary | ICD-10-CM | POA: Insufficient documentation

## 2015-06-24 DIAGNOSIS — Z9049 Acquired absence of other specified parts of digestive tract: Secondary | ICD-10-CM | POA: Diagnosis not present

## 2015-06-24 DIAGNOSIS — Z7982 Long term (current) use of aspirin: Secondary | ICD-10-CM | POA: Insufficient documentation

## 2015-06-24 LAB — CBC WITH DIFFERENTIAL/PLATELET
Basophils Absolute: 0 10*3/uL (ref 0–0.1)
Basophils Relative: 1 %
EOS PCT: 3 %
Eosinophils Absolute: 0.1 10*3/uL (ref 0–0.7)
HEMATOCRIT: 41 % (ref 35.0–47.0)
HEMOGLOBIN: 13.3 g/dL (ref 12.0–16.0)
Lymphocytes Relative: 43 %
Lymphs Abs: 2.3 10*3/uL (ref 1.0–3.6)
MCH: 29.4 pg (ref 26.0–34.0)
MCHC: 32.3 g/dL (ref 32.0–36.0)
MCV: 91 fL (ref 80.0–100.0)
MONO ABS: 0.5 10*3/uL (ref 0.2–0.9)
MONOS PCT: 10 %
NEUTROS PCT: 43 %
Neutro Abs: 2.4 10*3/uL (ref 1.4–6.5)
PLATELETS: 133 10*3/uL — AB (ref 150–440)
RBC: 4.51 MIL/uL (ref 3.80–5.20)
RDW: 13.9 % (ref 11.5–14.5)
WBC: 5.4 10*3/uL (ref 3.6–11.0)

## 2015-06-24 LAB — COMPREHENSIVE METABOLIC PANEL
ALK PHOS: 47 U/L (ref 38–126)
ALT: 13 U/L — ABNORMAL LOW (ref 14–54)
AST: 20 U/L (ref 15–41)
Albumin: 4 g/dL (ref 3.5–5.0)
Anion gap: 8 (ref 5–15)
BILIRUBIN TOTAL: 0.7 mg/dL (ref 0.3–1.2)
BUN: 16 mg/dL (ref 6–20)
CHLORIDE: 103 mmol/L (ref 101–111)
CO2: 27 mmol/L (ref 22–32)
Calcium: 9.2 mg/dL (ref 8.9–10.3)
Creatinine, Ser: 0.91 mg/dL (ref 0.44–1.00)
GFR calc Af Amer: >60 mL/min (ref >60)
GFR, EST NON AFRICAN AMERICAN: 56 mL/min — AB (ref >60)
GLUCOSE: 97 mg/dL (ref 65–99)
Potassium: 4.4 mmol/L (ref 3.5–5.1)
Sodium: 138 mmol/L (ref 135–145)
TOTAL PROTEIN: 6.4 g/dL — AB (ref 6.5–8.1)

## 2015-06-24 MED ORDER — ANASTROZOLE 1 MG PO TABS: 1.0000 mg | ORAL_TABLET | Freq: Every day | ORAL | Status: DC

## 2015-06-24 NOTE — Progress Notes (Signed)
Patient does not have living will.  Never smoked.  Patient requesting refill for anastrazole - 90 day supply.

## 2015-06-24 NOTE — Progress Notes (Signed)
[place] @ [facility] Telephone:([phone]) [phone]  Fax:([phone]) [place]: 16-Feb-1929  MR#: [id number]  JYN#:[id number]  Patient Care Team: Idelle Crouch, MD as PCP - General (Internal Medicine)  CHIEF COMPLAINT:  Chief Complaint  Patient presents with  . Follow-up    Oncology History   81 year old female status post wide local excision and sentinel node biopsy for a pathologic stage I (T1 C. N0 M0 invasive carcinoma and invasive lobular carcinoma.(right upper and outer quadrant)  HER-2 receptor negative by FISH. Estrogen receptor more than 90%. Progesterone receptor more than 90%.  Tumor size 1.5 x 1.2 cm. Sentinel lymph nodes are negative (diagnosis on May 02, 2012) on anastrozole and calcium and vitamin D since June of 2013 HPI:        Cancer of right breast, stage 1, estrogen receptor positive   81 year old lady with a history of carcinoma of breast INTERVAL HISTORY: Mrs. Umbarger came today further follow-up regarding carcinoma breast.  Taking Arimidex.  And calcium and vitamin D.  Patient is getting regular mammogram.  No bony pain no bony fracture.  Patient is here for ongoing evaluation and treatment consideration  REVIEW OF SYSTEMS:   GENERAL:  Feels good.  Active.  No fevers, sweats or weight loss. PERFORMANCE STATUS (ECOG):  *01 HEENT:  No visual changes, runny nose, sore throat, mouth sores or tenderness. Lungs: No shortness of breath or cough.  No hemoptysis. Cardiac:  No chest pain, palpitations, orthopnea, or PND. GI:  No nausea, vomiting, diarrhea, constipation, melena or hematochezia. GU:  No urgency, frequency, dysuria, or hematuria. Musculoskeletal:  No back pain.  No joint pain.  No muscle tenderness. Extremities:  No pain or swelling. Skin:  No rashes or skin changes. Neuro:  No headache, numbness or weakness, balance or coordination issues. Endocrine:  No diabetes, thyroid issues, hot flashes or night sweats. Psych:  No mood changes,  depression or anxiety. Pain:  No focal pain. Review of systems:  All other systems reviewed and found to be negative. As per HPI. Otherwise, a complete review of systems is negatve.  PAST MEDICAL HISTORY: Past Medical History  Diagnosis Date  . Cancer of right breast, stage 1, estrogen receptor positive 06/24/2015   Significant History/PMH:   uterine cancer:    Hypercholesterolemia:    HTN:    cholecystectomy:    Mastectomy - right:    Hysterectomy - Total:    Dilation and Curretage:    Hemorrhoidectomy:   Preventive Screening:  Has patient had any of the following test? Colonscopy  Mammography (1)   Last Colonoscopy: 2010 at [facility](1)   Last Mammography: May 2014(1)   Smoking History: Smoking History Never Smoked.(1)  PFSH: Comments: Positive for breast cancer in the sister  Social History: negative alcohol, negative tobacco  Additional Past Medical and Surgical History: Endometrial cancer.  Patient had total abdominal hysterectomy and bilateral salpingo-oophorectomy in 2009   Hypertension   Cholecystectomy   History of gout   ADVANCED DIRECTIVES:  Patient does not have any living will or healthcare power of attorney.  Information was given .  Available resources had been discussed.  We will follow-up on subsequent appointments regarding this issue HEALTH MAINTENANCE: History  Substance Use Topics  . Smoking status: Not on file  . Smokeless tobacco: Not on file  . Alcohol Use: Not on file      No Known Allergies  Current Outpatient Prescriptions  Medication Sig Dispense Refill  . acetaminophen (TYLENOL) 500  MG tablet Take by mouth.    Marland Kitchen anastrozole (ARIMIDEX) 1 MG tablet TAKE 1 TABLET BY MOUTH EVERY DAY 30 tablet 0  . anastrozole (ARIMIDEX) 1 MG tablet Take 1 tablet (1 mg total) by mouth daily. 90 tablet 3  . aspirin EC 81 MG tablet Take 81 mg by mouth.    Marland Kitchen atenolol (TENORMIN) 50 MG tablet Take 50 mg by mouth.    . calcium carbonate (CALCIUM 600) 600 MG  TABS tablet Take 600 mg by mouth.    . enalapril (VASOTEC) 20 MG tablet Take 20 mg by mouth.    . Multiple Vitamins-Minerals (CENTRUM SILVER PO) Take by mouth.    Marland Kitchen omeprazole (PRILOSEC) 20 MG capsule Take 20 mg by mouth.    . simvastatin (ZOCOR) 40 MG tablet Take 40 mg by mouth.     No current facility-administered medications for this visit.    OBJECTIVE:  Filed Vitals:   06/24/15 1212  BP: 158/77  Pulse: 61  Temp: 97 F (36.1 C)     There is no height on file to calculate BMI.    ECOG FS:1 - Symptomatic but completely ambulatory  PHYSICAL EXAM: GENERAL:  Well developed, well nourished, sitting comfortably in the exam room in no acute distress. MENTAL STATUS:  Alert and oriented to person, place and time.  ENT:  Oropharynx clear without lesion.  Tongue normal. Mucous membranes moist.  RESPIRATORY:  Clear to auscultation without rales, wheezes or rhonchi. CARDIOVASCULAR:  Regular rate and rhythm without murmur, rub or gallop. BREAST:  Right breast without masses, skin changes or nipple discharge.  Left breast without masses, skin changes or nipple discharge. ABDOMEN:  Soft, non-tender, with active bowel sounds, and no hepatosplenomegaly.  No masses. BACK:  No CVA tenderness.  No tenderness on percussion of the back or rib cage. SKIN:  No rashes, ulcers or lesions. EXTREMITIES: No edema, no skin discoloration or tenderness.  No palpable cords. LYMPH NODES: No palpable cervical, supraclavicular, axillary or inguinal adenopathy  NEUROLOGICAL: Unremarkable. PSYCH:  Appropriate.   LAB RESULTS:  Appointment on 06/24/2015  Component Date Value Ref Range Status  . WBC 06/24/2015 5.4  3.6 - 11.0 K/uL Final  . RBC 06/24/2015 4.51  3.80 - 5.20 MIL/uL Final  . Hemoglobin 06/24/2015 13.3  12.0 - 16.0 g/dL Final  . HCT 06/24/2015 41.0  35.0 - 47.0 % Final  . MCV 06/24/2015 91.0  80.0 - 100.0 fL Final  . MCH 06/24/2015 29.4  26.0 - 34.0 pg Final  . MCHC 06/24/2015 32.3  32.0 - 36.0  g/dL Final  . RDW 06/24/2015 13.9  11.5 - 14.5 % Final  . Platelets 06/24/2015 133* 150 - 440 K/uL Final  . Neutrophils Relative % 06/24/2015 43   Final  . Neutro Abs 06/24/2015 2.4  1.4 - 6.5 K/uL Final  . Lymphocytes Relative 06/24/2015 43   Final  . Lymphs Abs 06/24/2015 2.3  1.0 - 3.6 K/uL Final  . Monocytes Relative 06/24/2015 10   Final  . Monocytes Absolute 06/24/2015 0.5  0.2 - 0.9 K/uL Final  . Eosinophils Relative 06/24/2015 3   Final  . Eosinophils Absolute 06/24/2015 0.1  0 - 0.7 K/uL Final  . Basophils Relative 06/24/2015 1   Final  . Basophils Absolute 06/24/2015 0.0  0 - 0.1 K/uL Final  . Sodium 06/24/2015 138  135 - 145 mmol/L Final  . Potassium 06/24/2015 4.4  3.5 - 5.1 mmol/L Final  . Chloride 06/24/2015 103  101 - 111  mmol/L Final  . CO2 06/24/2015 27  22 - 32 mmol/L Final  . Glucose, Bld 06/24/2015 97  65 - 99 mg/dL Final  . BUN 06/24/2015 16  6 - 20 mg/dL Final  . Creatinine, Ser 06/24/2015 0.91  0.44 - 1.00 mg/dL Final  . Calcium 06/24/2015 9.2  8.9 - 10.3 mg/dL Final  . Total Protein 06/24/2015 6.4* 6.5 - 8.1 g/dL Final  . Albumin 06/24/2015 4.0  3.5 - 5.0 g/dL Final  . AST 06/24/2015 20  15 - 41 U/L Final  . ALT 06/24/2015 13* 14 - 54 U/L Final  . Alkaline Phosphatase 06/24/2015 47  38 - 126 U/L Final  . Total Bilirubin 06/24/2015 0.7  0.3 - 1.2 mg/dL Final  . GFR calc non Af Amer 06/24/2015 56* >60 mL/min Final  . GFR calc Af Amer 06/24/2015 >60  >60 mL/min Final   Comment: (NOTE) The eGFR has been calculated using the CKD EPI equation. This calculation has not been validated in all clinical situations. eGFR's persistently <60 mL/min signify possible Chronic Kidney Disease.   . Anion gap 06/24/2015 8  5 - 15 Final      ASSESSMENT:  Cancer of breast stage I disease. On anastrozole MEDICAL DECISION MAKING:   No evidence of progressive disease.  Had a mammogram done in April and was reported to be abnormal so biopsy was done which was  negative. Continue follow-up with another mammogram in 12 months Patient expressed understanding and was in agreement with this plan. She also understands that She can call clinic at any time with any questions, concerns, or complaints.   Forest Gleason, MD   06/24/2015 1:54 PM

## 2015-07-13 ENCOUNTER — Other Ambulatory Visit: Payer: Self-pay | Admitting: Oncology

## 2015-12-30 ENCOUNTER — Inpatient Hospital Stay: Payer: Medicare Other

## 2015-12-30 ENCOUNTER — Ambulatory Visit: Payer: BLUE CROSS/BLUE SHIELD | Admitting: Oncology

## 2015-12-30 ENCOUNTER — Other Ambulatory Visit: Payer: BLUE CROSS/BLUE SHIELD

## 2015-12-30 ENCOUNTER — Inpatient Hospital Stay: Payer: Medicare Other | Attending: Oncology | Admitting: Oncology

## 2015-12-30 VITALS — BP 174/73 | HR 77 | Temp 96.7°F | Resp 18 | Wt 152.4 lb

## 2015-12-30 DIAGNOSIS — C50919 Malignant neoplasm of unspecified site of unspecified female breast: Secondary | ICD-10-CM

## 2015-12-30 DIAGNOSIS — Z79899 Other long term (current) drug therapy: Secondary | ICD-10-CM | POA: Diagnosis not present

## 2015-12-30 DIAGNOSIS — Z170 Estrogen receptor positive status [ER+]: Secondary | ICD-10-CM | POA: Insufficient documentation

## 2015-12-30 DIAGNOSIS — Z7982 Long term (current) use of aspirin: Secondary | ICD-10-CM | POA: Diagnosis not present

## 2015-12-30 DIAGNOSIS — C50411 Malignant neoplasm of upper-outer quadrant of right female breast: Secondary | ICD-10-CM | POA: Diagnosis not present

## 2015-12-30 DIAGNOSIS — Z79811 Long term (current) use of aromatase inhibitors: Secondary | ICD-10-CM | POA: Insufficient documentation

## 2015-12-30 DIAGNOSIS — C50911 Malignant neoplasm of unspecified site of right female breast: Secondary | ICD-10-CM

## 2015-12-30 LAB — CBC WITH DIFFERENTIAL/PLATELET
BASOS ABS: 0 10*3/uL (ref 0–0.1)
BASOS PCT: 1 %
EOS PCT: 1 %
Eosinophils Absolute: 0.1 10*3/uL (ref 0–0.7)
HEMATOCRIT: 42.1 % (ref 35.0–47.0)
Hemoglobin: 14.1 g/dL (ref 12.0–16.0)
Lymphocytes Relative: 26 %
Lymphs Abs: 1.5 10*3/uL (ref 1.0–3.6)
MCH: 29.9 pg (ref 26.0–34.0)
MCHC: 33.5 g/dL (ref 32.0–36.0)
MCV: 89.2 fL (ref 80.0–100.0)
MONO ABS: 0.5 10*3/uL (ref 0.2–0.9)
MONOS PCT: 8 %
NEUTROS ABS: 3.8 10*3/uL (ref 1.4–6.5)
Neutrophils Relative %: 64 %
Platelets: 147 10*3/uL — ABNORMAL LOW (ref 150–440)
RBC: 4.72 MIL/uL (ref 3.80–5.20)
RDW: 13.7 % (ref 11.5–14.5)
WBC: 5.9 10*3/uL (ref 3.6–11.0)

## 2015-12-30 LAB — COMPREHENSIVE METABOLIC PANEL
ALT: 14 U/L (ref 14–54)
ANION GAP: 9 (ref 5–15)
AST: 16 U/L (ref 15–41)
Albumin: 3.8 g/dL (ref 3.5–5.0)
Alkaline Phosphatase: 46 U/L (ref 38–126)
BILIRUBIN TOTAL: 0.5 mg/dL (ref 0.3–1.2)
BUN: 17 mg/dL (ref 6–20)
CHLORIDE: 102 mmol/L (ref 101–111)
CO2: 26 mmol/L (ref 22–32)
Calcium: 9.3 mg/dL (ref 8.9–10.3)
Creatinine, Ser: 0.82 mg/dL (ref 0.44–1.00)
GFR calc Af Amer: >60 mL/min (ref >60)
Glucose, Bld: 112 mg/dL — ABNORMAL HIGH (ref 65–99)
Potassium: 3.8 mmol/L (ref 3.5–5.1)
Sodium: 137 mmol/L (ref 135–145)
TOTAL PROTEIN: 6.7 g/dL (ref 6.5–8.1)

## 2015-12-30 NOTE — Progress Notes (Signed)
Brittney Chaney  [phone]  [phone]) [phone]     Brittney Chaney DOB: [birth year]  MR#: [id number]  [id number]#:[id number]  Patient Care Team: Idelle Crouch, MD as PCP - General (Internal Medicine)  CHIEF COMPLAINT:  Chief Complaint  Patient presents with  . Breast Cancer    INTERVAL HISTORY: Patient is here for continued follow-up and treatment consideration regarding carcinoma of breast. Patient was diagnosed in May 2013 with stage I invasive carcinoma and invasive lobular carcinoma of the right upper and outer quadrant. She reports overall feeling very well. Her last mammogram was in April 2016, she is due in April 2017. Patient is currently on anastrozole, calcium, and vitamin D and tolerating very well this was started in June 2013. Patient currently denies any acute fractures, complaints of pain, or emergency room visits.  REVIEW OF SYSTEMS:   Review of Systems  Constitutional: Negative for fever, chills, weight loss, malaise/fatigue and diaphoresis.  HENT: Negative.   Eyes: Negative.   Respiratory: Negative for cough, hemoptysis, sputum production, shortness of breath and wheezing.   Cardiovascular: Negative for chest pain, palpitations, orthopnea, claudication, leg swelling and PND.  Gastrointestinal: Negative for heartburn, nausea, vomiting, abdominal pain, diarrhea, constipation, blood in stool and melena.  Genitourinary: Negative.   Musculoskeletal: Negative.   Skin: Negative.   Neurological: Negative for dizziness, tingling, focal weakness, seizures and weakness.  Endo/Heme/Allergies: Does not bruise/bleed easily.  Psychiatric/Behavioral: Negative for depression. The patient is not nervous/anxious and does not have insomnia.     As per HPI. Otherwise, a complete review of systems is negatve.  ONCOLOGY HISTORY: Oncology History   82 year old female status post wide local excision and sentinel node biopsy for a pathologic stage I (T1 C. N0 M0  invasive carcinoma and invasive lobular carcinoma.(right upper and outer quadrant)  HER-2 receptor negative by FISH. Estrogen receptor more than 90%. Progesterone receptor more than 90%.  Tumor size 1.5 x 1.2 cm. Sentinel lymph nodes are negative (diagnosis on May 02, 2012) on anastrozole and calcium and vitamin D since June of 2013 HPI:        Cancer of right breast, stage 1, estrogen receptor positive ([place])    PAST MEDICAL HISTORY: Past Medical History  Diagnosis Date  . Cancer of right breast, stage 1, estrogen receptor positive 06/24/2015    PAST SURGICAL HISTORY: No past surgical history on file.  FAMILY HISTORY No family history on file.  GYNECOLOGIC HISTORY:  No LMP recorded.     ADVANCED DIRECTIVES:    HEALTH MAINTENANCE: Social History  Substance Use Topics  . Smoking status: Never Smoker   . Smokeless tobacco: Not on file  . Alcohol Use: Not on file     Colonoscopy:  PAP:  Bone density:  Lipid panel:  No Known Allergies  Current Outpatient Prescriptions  Medication Sig Dispense Refill  . acetaminophen (TYLENOL) 500 MG tablet Take by mouth.    . ALPRAZolam (XANAX) 0.5 MG tablet Take by mouth.    Marland Kitchen anastrozole (ARIMIDEX) 1 MG tablet Take 1 tablet (1 mg total) by mouth daily. 90 tablet 3  . aspirin EC 81 MG tablet Take 81 mg by mouth.    Marland Kitchen atenolol (TENORMIN) 50 MG tablet Take 50 mg by mouth.    Marland Kitchen azithromycin (ZITHROMAX) 250 MG tablet 2 tabs on day one, then 1 tab daily x 4 days    . calcium carbonate (CALCIUM 600) 600 MG TABS tablet Take 600 mg by mouth.    . enalapril (  VASOTEC) 20 MG tablet Take 20 mg by mouth.    . Multiple Vitamins-Minerals (CENTRUM SILVER PO) Take by mouth.    Marland Kitchen omeprazole (PRILOSEC) 20 MG capsule Take 20 mg by mouth.    . simvastatin (ZOCOR) 40 MG tablet Take 40 mg by mouth.     No current facility-administered medications for this visit.    OBJECTIVE: BP 174/73 mmHg  Pulse 77  Temp(Src) 96.7 F (35.9 C) (Tympanic)   Resp 18  Wt 152 lb 6 oz (69.117 kg)   There is no height on file to calculate BMI.    ECOG FS:0 - Asymptomatic  General: Well-developed, well-nourished, no acute distress. Eyes: Pink conjunctiva, anicteric sclera. HEENT: Normocephalic, moist mucous membranes, clear oropharnyx. Lungs: Clear to auscultation bilaterally. Heart: Regular rate and rhythm. No rubs, murmurs, or gallops. Abdomen: Soft, nontender, nondistended. No organomegaly noted, normoactive bowel sounds. Breast: Breast palpated in a circular manner in the sitting and supine positions.  No masses or fullness palpated.  Axilla palpated in both positions with no masses or fullness palpated. Scarring present of right breast. Musculoskeletal: No edema, cyanosis, or clubbing. Neuro: Alert, answering all questions appropriately. Cranial nerves grossly intact. Skin: No rashes or petechiae noted. Psych: Normal affect. Lymphatics: No cervical, clavicular, axillary  LAD.   LAB RESULTS:  No visits with results within 3 Day(s) from this visit. Latest known visit with results is:  Appointment on 06/24/2015  Component Date Value Ref Range Status  . WBC 06/24/2015 5.4  3.6 - 11.0 K/uL Final  . RBC 06/24/2015 4.51  3.80 - 5.20 MIL/uL Final  . Hemoglobin 06/24/2015 13.3  12.0 - 16.0 g/dL Final  . HCT 06/24/2015 41.0  35.0 - 47.0 % Final  . MCV 06/24/2015 91.0  80.0 - 100.0 fL Final  . MCH 06/24/2015 29.4  26.0 - 34.0 pg Final  . MCHC 06/24/2015 32.3  32.0 - 36.0 g/dL Final  . RDW 06/24/2015 13.9  11.5 - 14.5 % Final  . Platelets 06/24/2015 133* 150 - 440 K/uL Final  . Neutrophils Relative % 06/24/2015 43   Final  . Neutro Abs 06/24/2015 2.4  1.4 - 6.5 K/uL Final  . Lymphocytes Relative 06/24/2015 43   Final  . Lymphs Abs 06/24/2015 2.3  1.0 - 3.6 K/uL Final  . Monocytes Relative 06/24/2015 10   Final  . Monocytes Absolute 06/24/2015 0.5  0.2 - 0.9 K/uL Final  . Eosinophils Relative 06/24/2015 3   Final  . Eosinophils Absolute  06/24/2015 0.1  0 - 0.7 K/uL Final  . Basophils Relative 06/24/2015 1   Final  . Basophils Absolute 06/24/2015 0.0  0 - 0.1 K/uL Final  . Sodium 06/24/2015 138  135 - 145 mmol/L Final  . Potassium 06/24/2015 4.4  3.5 - 5.1 mmol/L Final  . Chloride 06/24/2015 103  101 - 111 mmol/L Final  . CO2 06/24/2015 27  22 - 32 mmol/L Final  . Glucose, Bld 06/24/2015 97  65 - 99 mg/dL Final  . BUN 06/24/2015 16  6 - 20 mg/dL Final  . Creatinine, Ser 06/24/2015 0.91  0.44 - 1.00 mg/dL Final  . Calcium 06/24/2015 9.2  8.9 - 10.3 mg/dL Final  . Total Protein 06/24/2015 6.4* 6.5 - 8.1 g/dL Final  . Albumin 06/24/2015 4.0  3.5 - 5.0 g/dL Final  . AST 06/24/2015 20  15 - 41 U/L Final  . ALT 06/24/2015 13* 14 - 54 U/L Final  . Alkaline Phosphatase 06/24/2015 47  38 - 126 U/L  Final  . Total Bilirubin 06/24/2015 0.7  0.3 - 1.2 mg/dL Final  . GFR calc non Af Amer 06/24/2015 56* >60 mL/min Final  . GFR calc Af Amer 06/24/2015 >60  >60 mL/min Final   Comment: (NOTE) The eGFR has been calculated using the CKD EPI equation. This calculation has not been validated in all clinical situations. eGFR's persistently <60 mL/min signify possible Chronic Kidney Disease.   . Anion gap 06/24/2015 8  5 - 15 Final    STUDIES: No results found.  ASSESSMENT:  Carcinoma of right breast, upper outer quadrant, stage I disease, ER/PR positive.  PLAN:   1. Carcinoma of right breast. Clinically there is no evidence of recurrent disease. Her last mammogram was in April 2016, she will have a diagnostic mammogram ordered for April 2017. She is currently on anastrozole, calcium, and vitamin D and tolerating very well. She overall reports feeling very well for her age.  We'll continue with routine follow-up in approximately 6 months. Mammogram will be ordered and scheduled today when leaving clinic.  Patient expressed understanding and was in agreement with this plan. She also understands that She can call clinic at any time  with any questions, concerns, or complaints.   Dr. Oliva Bustard was available for consultation and review of plan of care for this patient.   Evlyn Kanner, NP   12/30/2015 12:13 PM

## 2015-12-31 ENCOUNTER — Telehealth: Payer: Self-pay | Admitting: *Deleted

## 2015-12-31 DIAGNOSIS — C50919 Malignant neoplasm of unspecified site of unspecified female breast: Secondary | ICD-10-CM

## 2015-12-31 MED ORDER — ANASTROZOLE 1 MG PO TABS: 1.0000 mg | ORAL_TABLET | Freq: Every day | ORAL | Status: DC

## 2015-12-31 NOTE — Telephone Encounter (Signed)
Escribed

## 2016-03-12 ENCOUNTER — Ambulatory Visit
Admission: RE | Admit: 2016-03-12 | Discharge: 2016-03-12 | Disposition: A | Discharge status: Home / Self Care | Payer: Medicare Other | Source: Ambulatory Visit | Attending: Family Medicine | Admitting: Family Medicine

## 2016-03-12 ENCOUNTER — Other Ambulatory Visit: Payer: Self-pay | Admitting: Family Medicine

## 2016-03-12 DIAGNOSIS — C50911 Malignant neoplasm of unspecified site of right female breast: Secondary | ICD-10-CM

## 2016-03-12 DIAGNOSIS — Z853 Personal history of malignant neoplasm of breast: Secondary | ICD-10-CM | POA: Insufficient documentation

## 2016-03-12 DIAGNOSIS — Z170 Estrogen receptor positive status [ER+]: Principal | ICD-10-CM

## 2016-03-12 DIAGNOSIS — R922 Inconclusive mammogram: Secondary | ICD-10-CM | POA: Insufficient documentation

## 2016-06-28 ENCOUNTER — Ambulatory Visit: Payer: BLUE CROSS/BLUE SHIELD | Admitting: Hematology and Oncology

## 2016-06-28 ENCOUNTER — Other Ambulatory Visit: Payer: BLUE CROSS/BLUE SHIELD

## 2016-07-05 ENCOUNTER — Other Ambulatory Visit: Payer: Self-pay | Admitting: *Deleted

## 2016-07-05 ENCOUNTER — Inpatient Hospital Stay: Payer: Medicare Other

## 2016-07-05 ENCOUNTER — Encounter (INDEPENDENT_AMBULATORY_CARE_PROVIDER_SITE_OTHER): Payer: Self-pay

## 2016-07-05 ENCOUNTER — Inpatient Hospital Stay: Payer: Medicare Other | Attending: Hematology and Oncology | Admitting: Hematology and Oncology

## 2016-07-05 ENCOUNTER — Other Ambulatory Visit: Payer: Self-pay | Admitting: Hematology and Oncology

## 2016-07-05 VITALS — BP 182/86 | HR 66 | Temp 98.5°F | Resp 18 | Wt 154.5 lb

## 2016-07-05 DIAGNOSIS — Z79899 Other long term (current) drug therapy: Secondary | ICD-10-CM | POA: Diagnosis not present

## 2016-07-05 DIAGNOSIS — Z170 Estrogen receptor positive status [ER+]: Principal | ICD-10-CM

## 2016-07-05 DIAGNOSIS — C50911 Malignant neoplasm of unspecified site of right female breast: Secondary | ICD-10-CM

## 2016-07-05 DIAGNOSIS — Z923 Personal history of irradiation: Secondary | ICD-10-CM | POA: Insufficient documentation

## 2016-07-05 DIAGNOSIS — Z7982 Long term (current) use of aspirin: Secondary | ICD-10-CM | POA: Diagnosis not present

## 2016-07-05 DIAGNOSIS — C50411 Malignant neoplasm of upper-outer quadrant of right female breast: Secondary | ICD-10-CM | POA: Diagnosis not present

## 2016-07-05 DIAGNOSIS — M8580 Other specified disorders of bone density and structure, unspecified site: Secondary | ICD-10-CM

## 2016-07-05 LAB — CBC WITH DIFFERENTIAL/PLATELET
BASOS PCT: 1 %
Basophils Absolute: 0 10*3/uL (ref 0–0.1)
EOS ABS: 0.1 10*3/uL (ref 0–0.7)
Eosinophils Relative: 3 %
HCT: 42.2 % (ref 35.0–47.0)
Hemoglobin: 14.5 g/dL (ref 12.0–16.0)
LYMPHS ABS: 1.8 10*3/uL (ref 1.0–3.6)
Lymphocytes Relative: 34 %
MCH: 30.6 pg (ref 26.0–34.0)
MCHC: 34.3 g/dL (ref 32.0–36.0)
MCV: 89.1 fL (ref 80.0–100.0)
MONO ABS: 0.5 10*3/uL (ref 0.2–0.9)
MONOS PCT: 10 %
Neutro Abs: 2.9 10*3/uL (ref 1.4–6.5)
Neutrophils Relative %: 52 %
Platelets: 129 10*3/uL — ABNORMAL LOW (ref 150–440)
RBC: 4.74 MIL/uL (ref 3.80–5.20)
RDW: 14.1 % (ref 11.5–14.5)
WBC: 5.4 10*3/uL (ref 3.6–11.0)

## 2016-07-05 LAB — COMPREHENSIVE METABOLIC PANEL
ALBUMIN: 4.3 g/dL (ref 3.5–5.0)
ALT: 15 U/L (ref 14–54)
ANION GAP: 6 (ref 5–15)
AST: 20 U/L (ref 15–41)
Alkaline Phosphatase: 47 U/L (ref 38–126)
BILIRUBIN TOTAL: 0.8 mg/dL (ref 0.3–1.2)
BUN: 16 mg/dL (ref 6–20)
CO2: 27 mmol/L (ref 22–32)
Calcium: 9.3 mg/dL (ref 8.9–10.3)
Chloride: 104 mmol/L (ref 101–111)
Creatinine, Ser: 0.87 mg/dL (ref 0.44–1.00)
GFR calc non Af Amer: 59 mL/min — ABNORMAL LOW (ref >60)
GLUCOSE: 92 mg/dL (ref 65–99)
POTASSIUM: 4.2 mmol/L (ref 3.5–5.1)
SODIUM: 137 mmol/L (ref 135–145)
TOTAL PROTEIN: 6.7 g/dL (ref 6.5–8.1)

## 2016-07-05 NOTE — Progress Notes (Signed)
[facility] day:  07/05/16  Chief Complaint: Brittney Chaney is a 82 y.o. female with stage I right breast cancer who is seen for reassessment.  HPI:  The patient was diagnosed with stage I right breast cancer in 04/2012.  She underwent wide excision and sentinel lymph node biopsy by Dr. Rochel Brome on 05/02/2012.  Pathology revealed a 1.5 cm grade I invasive lobular carcinoma in the right upper outer quadrant.  Tumor was ER positive (> 90%), PR positive (> 90%) and Her2/neu negative  She received accelerated partial breast radiation (Mammosite).  She began Arimidex in 05/2012.   Bilateral mammogram on 03/12/2016 revealed no evidence of malignancy.  She was last seen in the medical oncology clinic by Georgeanne Nim, NP on 12/30/2015.  At that time, she was doing well.  She states that she has her bone density studies with Dr. Doy Hutching.  Bone density on 09/19/2014 revealed osteopenia with a T-score of -1.4 in L1-L4, -1.2 in the left femoral neck, and -0.7 in the left hip.  She takes calcium and vitamin D.  Symptomatically, she denies any complaint.   Past Medical History:  Diagnosis Date  . Cancer of right breast, stage 1, estrogen receptor positive ([place]) 06/24/2015    Past Surgical History:  Procedure Laterality Date  . BREAST BIOPSY Right 03/19/2015   US guided biopsy - negative  . BREAST LUMPECTOMY Right 2013    No family history on file.  Social History:  reports that she has never smoked. She does not have any smokeless tobacco history on file. Her alcohol and drug histories are not on file.  She states that she lost her husband on 02/20/2016.  She lives in [place].  Her daughter has lived at [place] for 18 years.  She visits her 3 times a week.  The patient does not drive.  She was brought by a neighbor today.  The patient is alone today in clinic.  Allergies: No Known Allergies  Current Medications: Current Outpatient Prescriptions   Medication Sig Dispense Refill  . acetaminophen (TYLENOL) 500 MG tablet Take by mouth.    . ALPRAZolam (XANAX) 0.5 MG tablet Take by mouth.    Marland Kitchen anastrozole (ARIMIDEX) 1 MG tablet Take 1 tablet (1 mg total) by mouth daily. 90 tablet 3  . aspirin EC 81 MG tablet Take 81 mg by mouth.    Marland Kitchen atenolol (TENORMIN) 50 MG tablet Take 50 mg by mouth.    . calcium carbonate (CALCIUM 600) 600 MG TABS tablet Take 600 mg by mouth.    . enalapril (VASOTEC) 20 MG tablet Take 20 mg by mouth.    . Multiple Vitamins-Minerals (CENTRUM SILVER PO) Take by mouth.    Marland Kitchen omeprazole (PRILOSEC) 20 MG capsule Take 20 mg by mouth.    . simvastatin (ZOCOR) 40 MG tablet Take 40 mg by mouth.     No current facility-administered medications for this visit.     Review of Systems:  GENERAL:  Feels good.  No fevers, sweats or weight loss. PERFORMANCE STATUS (ECOG):  1 HEENT:  No visual changes, runny nose, sore throat, mouth sores or tenderness. Lungs: No shortness of breath or cough.  No hemoptysis. Cardiac:  No chest pain, palpitations, orthopnea, or PND. GI:  No nausea, vomiting, diarrhea, constipation, melena or hematochezia. GU:  No urgency, frequency, dysuria, or hematuria. Musculoskeletal:  No back pain.  No joint pain.  No muscle tenderness. Extremities:  No pain or swelling.  Skin:  No rashes or skin changes. Neuro:  No headache, numbness or weakness, balance or coordination issues. Endocrine:  No diabetes, thyroid issues, hot flashes or night sweats. Psych:  No mood changes, depression or anxiety. Pain:  No focal pain. Review of systems:  All other systems reviewed and found to be negative.  Physical Exam: Blood pressure (!) 182/86, pulse 66, temperature 98.5 F (36.9 C), temperature source Tympanic, resp. rate 18, weight 154 lb 8.7 oz (70.1 kg). GENERAL:  Well developed, well nourished, woman sitting comfortably in the exam room in no acute distress. MENTAL STATUS:  Alert and oriented to person, place and  time. HEAD:  Short gray hair.  Normocephalic, atraumatic, face symmetric, no Cushingoid features. EYES:  Oval glasses.  Dark blue eyes.  Left lid lag.  Pupils equal round and reactive to light and accomodation.  No conjunctivitis or scleral icterus. ENT:  Oropharynx clear without lesion.  Tongue normal. Mucous membranes moist.  RESPIRATORY:  Clear to auscultation without rales, wheezes or rhonchi. CARDIOVASCULAR:  Regular rate and rhythm without murmur, rub or gallop. BREAST:  Right breast with post-operative and Mammosite scarring at the 11:30 position.  No discrete masses, skin changes or nipple discharge.  Left breast with fibrocystic changes 11-12 o'clock position without masses, skin changes or nipple discharge.  ABDOMEN:  Soft, non-tender, with active bowel sounds, and no hepatosplenomegaly.  No masses. SKIN:  No rashes, ulcers or lesions. EXTREMITIES: 1+ ankle edema.  No skin discoloration or tenderness.  No palpable cords. LYMPH NODES: No palpable cervical, supraclavicular, axillary or inguinal adenopathy  NEUROLOGICAL: Unremarkable. PSYCH:  Appropriate.  Appointment on 07/05/2016  Component Date Value Ref Range Status  . WBC 07/05/2016 5.4  3.6 - 11.0 K/uL Final  . RBC 07/05/2016 4.74  3.80 - 5.20 MIL/uL Final  . Hemoglobin 07/05/2016 14.5  12.0 - 16.0 g/dL Final  . HCT 07/05/2016 42.2  35.0 - 47.0 % Final  . MCV 07/05/2016 89.1  80.0 - 100.0 fL Final  . MCH 07/05/2016 30.6  26.0 - 34.0 pg Final  . MCHC 07/05/2016 34.3  32.0 - 36.0 g/dL Final  . RDW 07/05/2016 14.1  11.5 - 14.5 % Final  . Platelets 07/05/2016 129* 150 - 440 K/uL Final  . Neutrophils Relative % 07/05/2016 52  % Final  . Neutro Abs 07/05/2016 2.9  1.4 - 6.5 K/uL Final  . Lymphocytes Relative 07/05/2016 34  % Final  . Lymphs Abs 07/05/2016 1.8  1.0 - 3.6 K/uL Final  . Monocytes Relative 07/05/2016 10  % Final  . Monocytes Absolute 07/05/2016 0.5  0.2 - 0.9 K/uL Final  . Eosinophils Relative 07/05/2016 3  % Final   . Eosinophils Absolute 07/05/2016 0.1  0 - 0.7 K/uL Final  . Basophils Relative 07/05/2016 1  % Final  . Basophils Absolute 07/05/2016 0.0  0 - 0.1 K/uL Final  . Sodium 07/05/2016 137  135 - 145 mmol/L Final  . Potassium 07/05/2016 4.2  3.5 - 5.1 mmol/L Final  . Chloride 07/05/2016 104  101 - 111 mmol/L Final  . CO2 07/05/2016 27  22 - 32 mmol/L Final  . Glucose, Bld 07/05/2016 92  65 - 99 mg/dL Final  . BUN 07/05/2016 16  6 - 20 mg/dL Final  . Creatinine, Ser 07/05/2016 0.87  0.44 - 1.00 mg/dL Final  . Calcium 07/05/2016 9.3  8.9 - 10.3 mg/dL Final  . Total Protein 07/05/2016 6.7  6.5 - 8.1 g/dL Final  . Albumin 07/05/2016 4.3  3.5 - 5.0 g/dL Final  . AST 07/05/2016 20  15 - 41 U/L Final  . ALT 07/05/2016 15  14 - 54 U/L Final  . Alkaline Phosphatase 07/05/2016 47  38 - 126 U/L Final  . Total Bilirubin 07/05/2016 0.8  0.3 - 1.2 mg/dL Final  . GFR calc non Af Amer 07/05/2016 59* >60 mL/min Final  . GFR calc Af Amer 07/05/2016 >60  >60 mL/min Final   Comment: (NOTE) The eGFR has been calculated using the CKD EPI equation. This calculation has not been validated in all clinical situations. eGFR's persistently <60 mL/min signify possible Chronic Kidney Disease.   . Anion gap 07/05/2016 6  5 - 15 Final    Assessment:  Brittney Chaney is a 82 y.o. female with stage I right breast  S/p wide excision and sentinel lymph node biopsy on 05/02/2012.  Pathology revealed a 1.5 cm grade I invasive lobular carcinoma in the right upper outer quadrant.  Tumor was ER positive (> 90%), PR positive (> 90%) and Her2/neu negative  She received accelerated partial breast radiation (Mammosite).  She began Arimidex in 05/2012.   Bilateral mammogram on 03/12/2016 revealed no evidence of malignancy.  Bone density on 09/19/2014 revealed osteopenia with a T-score of -1.4 in L1-L4.  She takes calcium and vitamin D.  Symptomatically, she denies any complaint.  Exam reveals post-operative and post radiation  changes in the right breast.  Plan: 1.  Review entire medical history, diagnosis and management of breast cancer.  Continue Arimidex. 2.  Discuss osteopenia.  Discuss plan for repeat bone density in 09/2016.  Continue calcium and vitamin D. 3.  Continue Arimidex.  Patient requests 90 day mail order Rxs when needed. 4.  Anticipate next mammogram on 03/12/2017. 5.  RTC in 6 months for MD assess, labs (CBC with diff, CMP, CA27.29).   Lequita Asal, MD  07/05/2016, 12:14 PM

## 2016-07-05 NOTE — Progress Notes (Signed)
Patient is here today for follow up, no complaints. She does have a question about her femara pill.

## 2016-07-06 LAB — CANCER ANTIGEN 27.29: CA 27.29: 21.2 U/mL (ref 0.0–38.6)

## 2016-07-07 ENCOUNTER — Encounter: Payer: Self-pay | Admitting: Hematology and Oncology

## 2017-01-04 ENCOUNTER — Inpatient Hospital Stay: Payer: Medicare Other

## 2017-01-04 ENCOUNTER — Inpatient Hospital Stay: Payer: Medicare Other | Attending: Hematology and Oncology | Admitting: Hematology and Oncology

## 2017-01-04 ENCOUNTER — Encounter: Payer: Self-pay | Admitting: Hematology and Oncology

## 2017-01-04 ENCOUNTER — Telehealth: Payer: Self-pay | Admitting: *Deleted

## 2017-01-04 VITALS — BP 128/73 | HR 80 | Temp 96.2°F | Resp 18 | Wt 151.2 lb

## 2017-01-04 DIAGNOSIS — C50411 Malignant neoplasm of upper-outer quadrant of right female breast: Secondary | ICD-10-CM | POA: Insufficient documentation

## 2017-01-04 DIAGNOSIS — Z170 Estrogen receptor positive status [ER+]: Principal | ICD-10-CM

## 2017-01-04 DIAGNOSIS — Z79811 Long term (current) use of aromatase inhibitors: Secondary | ICD-10-CM

## 2017-01-04 DIAGNOSIS — C50911 Malignant neoplasm of unspecified site of right female breast: Secondary | ICD-10-CM

## 2017-01-04 DIAGNOSIS — Z7982 Long term (current) use of aspirin: Secondary | ICD-10-CM

## 2017-01-04 DIAGNOSIS — Z923 Personal history of irradiation: Secondary | ICD-10-CM | POA: Diagnosis not present

## 2017-01-04 DIAGNOSIS — Z79899 Other long term (current) drug therapy: Secondary | ICD-10-CM | POA: Insufficient documentation

## 2017-01-04 DIAGNOSIS — M8580 Other specified disorders of bone density and structure, unspecified site: Secondary | ICD-10-CM

## 2017-01-04 LAB — COMPREHENSIVE METABOLIC PANEL
ALT: 15 U/L (ref 14–54)
AST: 25 U/L (ref 15–41)
Albumin: 4.3 g/dL (ref 3.5–5.0)
Alkaline Phosphatase: 44 U/L (ref 38–126)
Anion gap: 10 (ref 5–15)
BUN: 21 mg/dL — ABNORMAL HIGH (ref 6–20)
CO2: 24 mmol/L (ref 22–32)
Calcium: 9.7 mg/dL (ref 8.9–10.3)
Chloride: 104 mmol/L (ref 101–111)
Creatinine, Ser: 0.99 mg/dL (ref 0.44–1.00)
GFR calc Af Amer: 59 mL/min — ABNORMAL LOW (ref >60)
GFR calc non Af Amer: 51 mL/min — ABNORMAL LOW (ref >60)
Glucose, Bld: 155 mg/dL — ABNORMAL HIGH (ref 65–99)
Potassium: 3.9 mmol/L (ref 3.5–5.1)
Sodium: 138 mmol/L (ref 135–145)
Total Bilirubin: 0.8 mg/dL (ref 0.3–1.2)
Total Protein: 7 g/dL (ref 6.5–8.1)

## 2017-01-04 LAB — CBC WITH DIFFERENTIAL/PLATELET
Basophils Absolute: 0 10*3/uL (ref 0–0.1)
Basophils Relative: 1 %
Eosinophils Absolute: 0.1 10*3/uL (ref 0–0.7)
Eosinophils Relative: 1 %
HCT: 43 % (ref 35.0–47.0)
Hemoglobin: 14.5 g/dL (ref 12.0–16.0)
Lymphocytes Relative: 27 %
Lymphs Abs: 1.6 10*3/uL (ref 1.0–3.6)
MCH: 30.2 pg (ref 26.0–34.0)
MCHC: 33.6 g/dL (ref 32.0–36.0)
MCV: 89.7 fL (ref 80.0–100.0)
Monocytes Absolute: 0.4 10*3/uL (ref 0.2–0.9)
Monocytes Relative: 6 %
Neutro Abs: 3.9 10*3/uL (ref 1.4–6.5)
Neutrophils Relative %: 65 %
Platelets: 138 10*3/uL — ABNORMAL LOW (ref 150–440)
RBC: 4.8 MIL/uL (ref 3.80–5.20)
RDW: 13.7 % (ref 11.5–14.5)
WBC: 5.9 10*3/uL (ref 3.6–11.0)

## 2017-01-04 MED ORDER — ANASTROZOLE 1 MG PO TABS: 1.0000 mg | ORAL_TABLET | Freq: Every day | ORAL | 3 refills | Status: DC

## 2017-01-04 NOTE — Progress Notes (Signed)
[facility] day:  01/04/17  Chief Complaint: Brittney Chaney is a 83 y.o. female with stage I right breast cancer who is seen for 6 month assessment.  HPI:  The patient was last seen in the medical oncology clinic on 07/05/2016.  At that time, she was seen for initial assessment by me.  She denied any complaint.   Exam revealed post-operative and post radiation changes in the right breast.  CBC revealed a platelet count of 129,000.  Comprehensive metabolic panel and UY40.34 (21.2) were normal.  During the interim, she denies any new complaints.  She denies any breast concerns.   Past Medical History:  Diagnosis Date  . Cancer of right breast, stage 1, estrogen receptor positive ([place]) 06/24/2015    Past Surgical History:  Procedure Laterality Date  . BREAST BIOPSY Right 03/19/2015   US guided biopsy - negative  . BREAST LUMPECTOMY Right 2013    History reviewed. No pertinent family history.  Social History:  reports that she has never smoked. She does not have any smokeless tobacco history on file. Her alcohol and drug histories are not on file.  She states that she lost her husband on 02/20/2016.  She lives in [place].  Her daughter has lived at [place] for 18 years.  She visits her 3 times a week.  The patient does not drive.  She was brought by a neighbor today.  The patient is alone today in clinic.  Allergies: No Known Allergies  Current Medications: Current Outpatient Prescriptions  Medication Sig Dispense Refill  . acetaminophen (TYLENOL) 500 MG tablet Take by mouth.    Marland Kitchen anastrozole (ARIMIDEX) 1 MG tablet Take 1 tablet (1 mg total) by mouth daily. 90 tablet 3  . aspirin EC 81 MG tablet Take 81 mg by mouth.    Marland Kitchen atenolol (TENORMIN) 50 MG tablet Take 50 mg by mouth.    . calcium carbonate (CALCIUM 600) 600 MG TABS tablet Take 600 mg by mouth.    . enalapril (VASOTEC) 20 MG tablet Take 20 mg by mouth.    . Multiple Vitamins-Minerals  (CENTRUM SILVER PO) Take by mouth.    Marland Kitchen omeprazole (PRILOSEC) 20 MG capsule Take 20 mg by mouth.    . simvastatin (ZOCOR) 40 MG tablet Take 40 mg by mouth.    . ALPRAZolam (XANAX) 0.5 MG tablet Take by mouth.     No current facility-administered medications for this visit.     Review of Systems:  GENERAL:  Feels good.  No fevers or sweats.  Weight stable. PERFORMANCE STATUS (ECOG):  1 HEENT:  No visual changes, runny nose, sore throat, mouth sores or tenderness. Lungs: No shortness of breath or cough.  No hemoptysis. Cardiac:  No chest pain, palpitations, orthopnea, or PND. GI:  No nausea, vomiting, diarrhea, constipation, melena or hematochezia. GU:  No urgency, frequency, dysuria, or hematuria. Musculoskeletal:  No back pain.  No joint pain.  No muscle tenderness. Extremities:  No pain or swelling. Skin:  No rashes or skin changes. Neuro:  No headache, numbness or weakness, balance or coordination issues. Endocrine:  No diabetes, thyroid issues, hot flashes or night sweats. Psych:  No mood changes, depression or anxiety. Pain:  No focal pain. Review of systems:  All other systems reviewed and found to be negative.  Physical Exam: Blood pressure 128/73, pulse 80, temperature (!) 96.2 F (35.7 C), temperature source Tympanic, resp. rate 18, weight 151 lb 3.8 oz (68.6 kg).  GENERAL:  Well developed, well nourished, woman sitting comfortably in the exam room in no acute distress. MENTAL STATUS:  Alert and oriented to person, place and time. HEAD:  Short gray hair.  Normocephalic, atraumatic, face symmetric, no Cushingoid features. EYES:  Oval glasses.  Gray/blue eyes.  Left lid lag.  Pupils equal round and reactive to light and accomodation.  No conjunctivitis or scleral icterus. ENT:  Oropharynx clear without lesion.  Tongue normal. Mucous membranes moist.  RESPIRATORY:  Clear to auscultation without rales, wheezes or rhonchi. CARDIOVASCULAR:  Regular rate and rhythm without murmur,  rub or gallop. BREAST:  Right breast with post-operative and Mammosite scarring at the 11:00 position.  No discrete masses, skin changes or nipple discharge.  Left breast with fibrocystic changes 11-12 o'clock position without masses, skin changes or nipple discharge.  ABDOMEN:  Soft, non-tender, with active bowel sounds, and no hepatosplenomegaly.  No masses. SKIN:  No rashes, ulcers or lesions. EXTREMITIES:  No edema, skin discoloration or tenderness.  No palpable cords. LYMPH NODES: No palpable cervical, supraclavicular, axillary or inguinal adenopathy  NEUROLOGICAL: Unremarkable. PSYCH:  Appropriate.   Appointment on 01/04/2017  Component Date Value Ref Range Status  . WBC 01/04/2017 5.9  3.6 - 11.0 K/uL Final  . RBC 01/04/2017 4.80  3.80 - 5.20 MIL/uL Final  . Hemoglobin 01/04/2017 14.5  12.0 - 16.0 g/dL Final  . HCT 01/04/2017 43.0  35.0 - 47.0 % Final  . MCV 01/04/2017 89.7  80.0 - 100.0 fL Final  . MCH 01/04/2017 30.2  26.0 - 34.0 pg Final  . MCHC 01/04/2017 33.6  32.0 - 36.0 g/dL Final  . RDW 01/04/2017 13.7  11.5 - 14.5 % Final  . Platelets 01/04/2017 138* 150 - 440 K/uL Final  . Neutrophils Relative % 01/04/2017 65  % Final  . Neutro Abs 01/04/2017 3.9  1.4 - 6.5 K/uL Final  . Lymphocytes Relative 01/04/2017 27  % Final  . Lymphs Abs 01/04/2017 1.6  1.0 - 3.6 K/uL Final  . Monocytes Relative 01/04/2017 6  % Final  . Monocytes Absolute 01/04/2017 0.4  0.2 - 0.9 K/uL Final  . Eosinophils Relative 01/04/2017 1  % Final  . Eosinophils Absolute 01/04/2017 0.1  0 - 0.7 K/uL Final  . Basophils Relative 01/04/2017 1  % Final  . Basophils Absolute 01/04/2017 0.0  0 - 0.1 K/uL Final  . Sodium 01/04/2017 138  135 - 145 mmol/L Final  . Potassium 01/04/2017 3.9  3.5 - 5.1 mmol/L Final  . Chloride 01/04/2017 104  101 - 111 mmol/L Final  . CO2 01/04/2017 24  22 - 32 mmol/L Final  . Glucose, Bld 01/04/2017 155* 65 - 99 mg/dL Final  . BUN 01/04/2017 21* 6 - 20 mg/dL Final  .  Creatinine, Ser 01/04/2017 0.99  0.44 - 1.00 mg/dL Final  . Calcium 01/04/2017 9.7  8.9 - 10.3 mg/dL Final  . Total Protein 01/04/2017 7.0  6.5 - 8.1 g/dL Final  . Albumin 01/04/2017 4.3  3.5 - 5.0 g/dL Final  . AST 01/04/2017 25  15 - 41 U/L Final  . ALT 01/04/2017 15  14 - 54 U/L Final  . Alkaline Phosphatase 01/04/2017 44  38 - 126 U/L Final  . Total Bilirubin 01/04/2017 0.8  0.3 - 1.2 mg/dL Final  . GFR calc non Af Amer 01/04/2017 51* >60 mL/min Final  . GFR calc Af Amer 01/04/2017 59* >60 mL/min Final   Comment: (NOTE) The eGFR has been calculated using the CKD  EPI equation. This calculation has not been validated in all clinical situations. eGFR's persistently <60 mL/min signify possible Chronic Kidney Disease.   . Anion gap 01/04/2017 10  5 - 15 Final    Assessment:  ABEGAIL KLOEPPEL is a 83 y.o. female with stage I right breast cancer s/p wide excision and sentinel lymph node biopsy on 05/02/2012.  Pathology revealed a 1.5 cm grade I invasive lobular carcinoma in the right upper outer quadrant.  Tumor was ER positive (> 90%), PR positive (> 90%) and Her2/neu negative  She received accelerated partial breast radiation (Mammosite).  She began Arimidex in 05/2012.   Bilateral mammogram on 03/12/2016 revealed no evidence of malignancy.  CA27.29 has been followed:  17.4 on 11/14/2012, 19.0 on 05/15/2013, 14.6 on 11/13/2013, 21.2 on 07/05/2016, and 21.8 on 01/04/2017.  Bone density on 09/19/2014 revealed osteopenia with a T-score of -1.4 in L1-L4.  She takes calcium and vitamin D.  Symptomatically, she denies any complaint.  Exam reveals stable post-operative and post radiation changes in the right breast.  Plan: 1.  Labs today:  CBC with diff, CMP, CA27.29. 2.  Discuss BCI testing re: benefit of 5 versus 10 years of hormonal therapy. 3.  Discuss osteopenia and need for repeat bone density.  Continue calcium and vitamin D. 4.  Continue Arimidex.  Patient requests 90 day mail order  Rxs when needed. 5.  Schedule bilateral mammogram on 03/12/2017. 6.  Schedule bone density study. 7.  RTC in 6 months for MD assessment, labs (CBC with diff, CMP, CA27.29), and review of imaging.   Lequita Asal, MD  01/04/2017, 10:40 AM

## 2017-01-04 NOTE — Telephone Encounter (Signed)
Discussed BCI testing with patient and gave her the written information on the test to review and let us know if she wants to have the test performed or not.  Pt voiced understanding.

## 2017-01-04 NOTE — Progress Notes (Signed)
Patient here today for her 6 mo follow up regarding breast cancer.  Offers no complaints.

## 2017-01-05 ENCOUNTER — Other Ambulatory Visit: Payer: Self-pay | Admitting: *Deleted

## 2017-01-05 LAB — CANCER ANTIGEN 27.29: CA 27.29: 21.8 U/mL (ref 0.0–38.6)

## 2017-01-05 NOTE — Telephone Encounter (Signed)
Sent 01/04/17. Confirmed with [facility] that they received rx

## 2017-02-07 ENCOUNTER — Encounter: Payer: Self-pay | Admitting: Hematology and Oncology

## 2017-03-16 ENCOUNTER — Ambulatory Visit
Admission: RE | Admit: 2017-03-16 | Discharge: 2017-03-16 | Disposition: A | Discharge status: Home / Self Care | Payer: Medicare Other | Source: Ambulatory Visit | Attending: Hematology and Oncology | Admitting: Hematology and Oncology

## 2017-03-16 ENCOUNTER — Telehealth: Payer: Self-pay | Admitting: *Deleted

## 2017-03-16 DIAGNOSIS — M8580 Other specified disorders of bone density and structure, unspecified site: Secondary | ICD-10-CM

## 2017-03-16 DIAGNOSIS — Z170 Estrogen receptor positive status [ER+]: Secondary | ICD-10-CM | POA: Diagnosis not present

## 2017-03-16 DIAGNOSIS — C50911 Malignant neoplasm of unspecified site of right female breast: Secondary | ICD-10-CM | POA: Diagnosis present

## 2017-03-16 HISTORY — DX: Malignant neoplasm of uterus, part unspecified: C55

## 2017-03-16 HISTORY — DX: Malignant neoplasm of unspecified site of unspecified female breast: C50.919

## 2017-03-16 NOTE — Telephone Encounter (Signed)
-----   Message from Lequita Asal, MD sent at 03/16/2017  4:50 PM EDT ----- Regarding: Please call patient with results of bone density  Patient has osteopenia.  Calcium and vitamin D.  M  ----- Message ----- From: Interface, Rad Results In Sent: 03/16/2017   1:01 PM To: Lequita Asal, MD

## 2017-03-16 NOTE — Telephone Encounter (Signed)
Called patient to inform her that she is osteopenic.  MD recommends she take Calcium 1200 mg daily and Vitamin D 800 mg daily.  Patient verbalized understanding.

## 2017-07-05 ENCOUNTER — Inpatient Hospital Stay (HOSPITAL_BASED_OUTPATIENT_CLINIC_OR_DEPARTMENT_OTHER): Payer: Medicare Other | Admitting: Hematology and Oncology

## 2017-07-05 ENCOUNTER — Other Ambulatory Visit: Payer: Self-pay

## 2017-07-05 ENCOUNTER — Inpatient Hospital Stay: Payer: Medicare Other | Attending: Hematology and Oncology

## 2017-07-05 ENCOUNTER — Encounter: Payer: Self-pay | Admitting: Hematology and Oncology

## 2017-07-05 VITALS — BP 150/80 | HR 68 | Temp 98.4°F | Resp 18 | Wt 150.4 lb

## 2017-07-05 DIAGNOSIS — Z923 Personal history of irradiation: Secondary | ICD-10-CM | POA: Diagnosis not present

## 2017-07-05 DIAGNOSIS — C50911 Malignant neoplasm of unspecified site of right female breast: Secondary | ICD-10-CM | POA: Diagnosis not present

## 2017-07-05 DIAGNOSIS — M8580 Other specified disorders of bone density and structure, unspecified site: Secondary | ICD-10-CM | POA: Insufficient documentation

## 2017-07-05 DIAGNOSIS — Z7982 Long term (current) use of aspirin: Secondary | ICD-10-CM

## 2017-07-05 DIAGNOSIS — Z8542 Personal history of malignant neoplasm of other parts of uterus: Secondary | ICD-10-CM

## 2017-07-05 DIAGNOSIS — Z170 Estrogen receptor positive status [ER+]: Secondary | ICD-10-CM

## 2017-07-05 DIAGNOSIS — Z79899 Other long term (current) drug therapy: Secondary | ICD-10-CM | POA: Diagnosis not present

## 2017-07-05 DIAGNOSIS — Z79811 Long term (current) use of aromatase inhibitors: Secondary | ICD-10-CM | POA: Diagnosis not present

## 2017-07-05 LAB — COMPREHENSIVE METABOLIC PANEL
ALT: 14 U/L (ref 14–54)
AST: 25 U/L (ref 15–41)
Albumin: 4 g/dL (ref 3.5–5.0)
Alkaline Phosphatase: 41 U/L (ref 38–126)
Anion gap: 9 (ref 5–15)
BUN: 17 mg/dL (ref 6–20)
CO2: 26 mmol/L (ref 22–32)
Calcium: 9.6 mg/dL (ref 8.9–10.3)
Chloride: 101 mmol/L (ref 101–111)
Creatinine, Ser: 0.89 mg/dL (ref 0.44–1.00)
GFR calc Af Amer: >60 mL/min (ref >60)
GFR calc non Af Amer: 57 mL/min — ABNORMAL LOW (ref >60)
Glucose, Bld: 110 mg/dL — ABNORMAL HIGH (ref 65–99)
Potassium: 4.2 mmol/L (ref 3.5–5.1)
Sodium: 136 mmol/L (ref 135–145)
Total Bilirubin: 0.6 mg/dL (ref 0.3–1.2)
Total Protein: 6.4 g/dL — ABNORMAL LOW (ref 6.5–8.1)

## 2017-07-05 LAB — CBC WITH DIFFERENTIAL/PLATELET
Basophils Absolute: 0 10*3/uL (ref 0–0.1)
Basophils Relative: 1 %
Eosinophils Absolute: 0.1 10*3/uL (ref 0–0.7)
Eosinophils Relative: 2 %
HCT: 40.8 % (ref 35.0–47.0)
Hemoglobin: 14 g/dL (ref 12.0–16.0)
Lymphocytes Relative: 37 %
Lymphs Abs: 1.9 10*3/uL (ref 1.0–3.6)
MCH: 30.9 pg (ref 26.0–34.0)
MCHC: 34.3 g/dL (ref 32.0–36.0)
MCV: 90 fL (ref 80.0–100.0)
Monocytes Absolute: 0.5 10*3/uL (ref 0.2–0.9)
Monocytes Relative: 9 %
Neutro Abs: 2.6 10*3/uL (ref 1.4–6.5)
Neutrophils Relative %: 51 %
Platelets: 140 10*3/uL — ABNORMAL LOW (ref 150–440)
RBC: 4.53 MIL/uL (ref 3.80–5.20)
RDW: 13.3 % (ref 11.5–14.5)
WBC: 5.1 10*3/uL (ref 3.6–11.0)

## 2017-07-05 NOTE — Progress Notes (Signed)
Patient offers no complaints today. 

## 2017-07-05 NOTE — Progress Notes (Signed)
[facility] day:  07/05/17  Chief Complaint: Brittney Chaney is a 83 y.o. female with stage I right breast cancer who is seen for 6 month assessment.  HPI:  The patient was last seen in the medical oncology clinic on 01/04/2017.  At that time, she denied any new complaints.  Exam was stable.  CA27.29 was normal.  We discussed BCI testing.  She continued Arimidex.  Bilateral mammogram on 03/16/2017 revealed no evidence of malignancy.    Bone density study on 03/16/2017 revealed osteopenia with a T score of -1.8 in the right femoral neck and -0.9 in the AP spine L1-L4.  During the interim, she has done well.  She denies any problems.  She has decided against BCI testing.   Past Medical History:  Diagnosis Date  . Breast cancer [facility]) 2013   right breast with radiation  . Cancer of right breast, stage 1, estrogen receptor positive ([place]) 2013  . Uterine cancer ([place]) 2010    Past Surgical History:  Procedure Laterality Date  . BREAST BIOPSY Right 03/19/2015   US guided biopsy - negative fat necrosis  . BREAST LUMPECTOMY Right 2013   breast can with rad    History reviewed. No pertinent family history.  Social History:  reports that she has never smoked. She has never used smokeless tobacco. Her alcohol and drug histories are not on file.  She states that she lost her husband on 02/20/2016.  She lives in [place].  Her daughter has lived at [place] for 18 years.  She visits her 3 times a week.  The patient does not drive.  She was brought by a neighbor today.  The patient is alone today in clinic.  Allergies: No Known Allergies  Current Medications: Current Outpatient Prescriptions  Medication Sig Dispense Refill  . acetaminophen (TYLENOL) 500 MG tablet Take by mouth.    . ALPRAZolam (XANAX) 0.5 MG tablet Take by mouth.    Marland Kitchen anastrozole (ARIMIDEX) 1 MG tablet Take 1 tablet (1 mg total) by mouth daily. 90 tablet 3  . aspirin EC 81 MG tablet  Take 81 mg by mouth.    Marland Kitchen atenolol (TENORMIN) 50 MG tablet Take 50 mg by mouth.    . calcium carbonate (CALCIUM 600) 600 MG TABS tablet Take 600 mg by mouth.    . enalapril (VASOTEC) 20 MG tablet Take 20 mg by mouth.    . Multiple Vitamins-Minerals (CENTRUM SILVER PO) Take by mouth.    Marland Kitchen omeprazole (PRILOSEC) 20 MG capsule Take 20 mg by mouth.    . simvastatin (ZOCOR) 40 MG tablet Take 40 mg by mouth.     No current facility-administered medications for this visit.     Review of Systems:  GENERAL:  Feels fine.  No fevers or sweats.  Weight down 1 pound. PERFORMANCE STATUS (ECOG):  1 HEENT:  No visual changes, runny nose, sore throat, mouth sores or tenderness. Lungs: No shortness of breath or cough.  No hemoptysis. Cardiac:  No chest pain, palpitations, orthopnea, or PND. GI:  No nausea, vomiting, diarrhea, constipation, melena or hematochezia. GU:  No urgency, frequency, dysuria, or hematuria. Musculoskeletal:  No back pain.  No joint pain.  No muscle tenderness. Extremities:  No pain or swelling. Skin:  No rashes or skin changes. Neuro:  No headache, numbness or weakness, balance or coordination issues. Endocrine:  No diabetes, thyroid issues, hot flashes or night sweats. Psych:  No mood changes, depression or  anxiety. Pain:  No focal pain. Review of systems:  All other systems reviewed and found to be negative.  Physical Exam: Blood pressure (!) 150/80, pulse 68, temperature 98.4 F (36.9 C), temperature source Tympanic, resp. rate 18, weight 150 lb 7 oz (68.2 kg). GENERAL:  Well developed, well nourished, elderly woman sitting comfortably in the exam room in no acute distress. MENTAL STATUS:  Alert and oriented to person, place and time. HEAD:  Short gray hair.  Normocephalic, atraumatic, face symmetric, no Cushingoid features. EYES:  Oval glasses.  Gray/blue eyes.  Pupils equal round and reactive to light and accomodation.  No conjunctivitis or scleral icterus. ENT:   Oropharynx clear without lesion.  Tongue normal. Mucous membranes moist.  RESPIRATORY:  Clear to auscultation without rales, wheezes or rhonchi. CARDIOVASCULAR:  Regular rate and rhythm without murmur, rub or gallop. BREAST:  Right breast with post-operative and Mammosite scarring at the 11:00 position (stable).  No discrete masses, skin changes or nipple discharge.  Left breast with fibrocystic changes 11-12 o'clock position without masses, skin changes or nipple discharge.  ABDOMEN:  Soft, non-tender, with active bowel sounds, and no hepatosplenomegaly.  No masses. SKIN:  No rashes, ulcers or lesions. EXTREMITIES:  No edema, skin discoloration or tenderness.  No palpable cords. LYMPH NODES: No palpable cervical, supraclavicular, axillary or inguinal adenopathy  NEUROLOGICAL: Unremarkable. PSYCH:  Appropriate.   Orders Only on 07/05/2017  Component Date Value Ref Range Status  . WBC 07/05/2017 5.1  3.6 - 11.0 K/uL Final  . RBC 07/05/2017 4.53  3.80 - 5.20 MIL/uL Final  . Hemoglobin 07/05/2017 14.0  12.0 - 16.0 g/dL Final  . HCT 07/05/2017 40.8  35.0 - 47.0 % Final  . MCV 07/05/2017 90.0  80.0 - 100.0 fL Final  . MCH 07/05/2017 30.9  26.0 - 34.0 pg Final  . MCHC 07/05/2017 34.3  32.0 - 36.0 g/dL Final  . RDW 07/05/2017 13.3  11.5 - 14.5 % Final  . Platelets 07/05/2017 140* 150 - 440 K/uL Final  . Neutrophils Relative % 07/05/2017 51  % Final  . Neutro Abs 07/05/2017 2.6  1.4 - 6.5 K/uL Final  . Lymphocytes Relative 07/05/2017 37  % Final  . Lymphs Abs 07/05/2017 1.9  1.0 - 3.6 K/uL Final  . Monocytes Relative 07/05/2017 9  % Final  . Monocytes Absolute 07/05/2017 0.5  0.2 - 0.9 K/uL Final  . Eosinophils Relative 07/05/2017 2  % Final  . Eosinophils Absolute 07/05/2017 0.1  0 - 0.7 K/uL Final  . Basophils Relative 07/05/2017 1  % Final  . Basophils Absolute 07/05/2017 0.0  0 - 0.1 K/uL Final  . Sodium 07/05/2017 136  135 - 145 mmol/L Final  . Potassium 07/05/2017 4.2  3.5 - 5.1  mmol/L Final  . Chloride 07/05/2017 101  101 - 111 mmol/L Final  . CO2 07/05/2017 26  22 - 32 mmol/L Final  . Glucose, Bld 07/05/2017 110* 65 - 99 mg/dL Final  . BUN 07/05/2017 17  6 - 20 mg/dL Final  . Creatinine, Ser 07/05/2017 0.89  0.44 - 1.00 mg/dL Final  . Calcium 07/05/2017 9.6  8.9 - 10.3 mg/dL Final  . Total Protein 07/05/2017 6.4* 6.5 - 8.1 g/dL Final  . Albumin 07/05/2017 4.0  3.5 - 5.0 g/dL Final  . AST 07/05/2017 25  15 - 41 U/L Final  . ALT 07/05/2017 14  14 - 54 U/L Final  . Alkaline Phosphatase 07/05/2017 41  38 - 126 U/L Final  .  Total Bilirubin 07/05/2017 0.6  0.3 - 1.2 mg/dL Final  . GFR calc non Af Amer 07/05/2017 57* >60 mL/min Final  . GFR calc Af Amer 07/05/2017 >60  >60 mL/min Final   Comment: (NOTE) The eGFR has been calculated using the CKD EPI equation. This calculation has not been validated in all clinical situations. eGFR's persistently <60 mL/min signify possible Chronic Kidney Disease.   . Anion gap 07/05/2017 9  5 - 15 Final    Assessment:  Brittney Chaney is a 83 y.o. female with stage I right breast cancer s/p wide excision and sentinel lymph node biopsy on 05/02/2012.  Pathology revealed a 1.5 cm grade I invasive lobular carcinoma in the right upper outer quadrant.  Tumor was ER positive (> 90%), PR positive (> 90%) and Her2/neu negative  She received accelerated partial breast radiation (Mammosite).  She began Arimidex in 05/2012.   Bilateral mammogram on 03/16/2017 revealed no evidence of malignancy.  CA27.29 has been followed:  17.4 on 11/14/2012, 19.0 on 05/15/2013, 14.6 on 11/13/2013, 21.2 on 07/05/2016, and 21.8 on 01/04/2017.  Bone density on 09/19/2014 revealed osteopenia with a T-score of -1.4 in L1-L4.  Bone density on 03/16/2017 revealed osteopenia with a - score of -1.8 in the right femoral neck and -0.9 in the AP spine L1-L4.  She takes calcium and vitamin D.  Symptomatically, she denies any complaint.  Exam reveals stable post-operative  and post radiation changes in the right breast.  Plan: 1.  Labs today:  CBC with diff, CMP, CA27.29. 2.  Discuss mammogram- no evidence of malignancy. 3.  Discuss slight increase in osteopenia.  Consider Prolia.  Side effects reviewed.  Discuss need for dental clearance prior to initiation.  Patient declined. 4.  Discuss patients thoughts about BCI testing.  Patient declined. 5.  Discuss discontinuation of Arimidex.  Patient stated that she would continue until she ran out of her pills.  She will decide whether she wanted to continue after that.  I discussed standard treatment with 5 years unless BCI testing would indicate she is at high risk.  We discussed side effects of continued Arimidex (bone loss). 6.  RTC in 6 months for MD assessment, labs (CBC with diff, CMP, CA27.29). 7.  Discuss yearly follow-up after next visit.   Lequita Asal, MD  07/05/2017, 11:47 AM

## 2017-07-06 ENCOUNTER — Telehealth: Payer: Self-pay | Admitting: *Deleted

## 2017-07-06 LAB — CANCER ANTIGEN 27.29: CA 27.29: 14.6 U/mL (ref 0.0–38.6)

## 2017-07-06 NOTE — Telephone Encounter (Signed)
-----   Message from Lequita Asal, MD sent at 07/06/2017  8:47 AM EDT ----- Regarding: Please call patient  CA27.29 is normal  ----- Message ----- From: Interface, Lab In [place] Sent: 07/05/2017   9:58 AM To: Lequita Asal, MD

## 2017-07-06 NOTE — Telephone Encounter (Signed)
Called patient to inform her that her Ca 27.29 is normal.

## 2017-12-05 ENCOUNTER — Telehealth: Payer: Self-pay | Admitting: *Deleted

## 2017-12-05 NOTE — Telephone Encounter (Signed)
Fax returned to [facility] not to refill prescription

## 2017-12-05 NOTE — Telephone Encounter (Signed)
  My note suggests that she was going to discontinue Armidex.  M

## 2017-12-05 NOTE — Telephone Encounter (Signed)
Pharmacy requesting refill of Arimidex. Should Arimidex be discontinued?  Plan: 1.  Labs today:  CBC with diff, CMP, CA27.29. 2.  Discuss mammogram- no evidence of malignancy. 3.  Discuss slight increase in osteopenia.  Consider Prolia.  Side effects reviewed.  Discuss need for dental clearance prior to initiation.  Patient declined. 4.  Discuss patients thoughts about BCI testing.  Patient declined. 5.  Discuss discontinuation of Arimidex.  Patient stated that she would continue until she ran out of her pills.  She will decide whether she wanted to continue after that.  I discussed standard treatment with 5 years unless BCI testing would indicate she is at high risk.  We discussed side effects of continued Arimidex (bone loss). 6.  RTC in 6 months for MD assessment, labs (CBC with diff, CMP, CA27.29). 7.  Discuss yearly follow-up after next visit.   Lequita Asal, MD  07/05/2017, 11:47 AM

## 2018-01-03 ENCOUNTER — Ambulatory Visit: Payer: BLUE CROSS/BLUE SHIELD | Admitting: Hematology and Oncology

## 2018-01-03 ENCOUNTER — Other Ambulatory Visit: Payer: BLUE CROSS/BLUE SHIELD

## 2018-01-04 ENCOUNTER — Inpatient Hospital Stay (HOSPITAL_BASED_OUTPATIENT_CLINIC_OR_DEPARTMENT_OTHER): Payer: Medicare Other | Admitting: Hematology and Oncology

## 2018-01-04 ENCOUNTER — Encounter: Payer: Self-pay | Admitting: Hematology and Oncology

## 2018-01-04 ENCOUNTER — Inpatient Hospital Stay: Payer: Medicare Other | Attending: Hematology and Oncology

## 2018-01-04 VITALS — BP 168/82 | HR 76 | Temp 97.6°F | Resp 18 | Wt 153.2 lb

## 2018-01-04 DIAGNOSIS — Z8542 Personal history of malignant neoplasm of other parts of uterus: Secondary | ICD-10-CM | POA: Insufficient documentation

## 2018-01-04 DIAGNOSIS — Z79811 Long term (current) use of aromatase inhibitors: Secondary | ICD-10-CM | POA: Diagnosis not present

## 2018-01-04 DIAGNOSIS — Z79899 Other long term (current) drug therapy: Secondary | ICD-10-CM | POA: Diagnosis not present

## 2018-01-04 DIAGNOSIS — M8580 Other specified disorders of bone density and structure, unspecified site: Secondary | ICD-10-CM | POA: Diagnosis not present

## 2018-01-04 DIAGNOSIS — Z170 Estrogen receptor positive status [ER+]: Secondary | ICD-10-CM

## 2018-01-04 DIAGNOSIS — C50911 Malignant neoplasm of unspecified site of right female breast: Secondary | ICD-10-CM

## 2018-01-04 DIAGNOSIS — Z7982 Long term (current) use of aspirin: Secondary | ICD-10-CM

## 2018-01-04 LAB — CBC WITH DIFFERENTIAL/PLATELET
Basophils Absolute: 0 10*3/uL (ref 0–0.1)
Basophils Relative: 1 %
Eosinophils Absolute: 0.1 10*3/uL (ref 0–0.7)
Eosinophils Relative: 3 %
HCT: 41.4 % (ref 35.0–47.0)
Hemoglobin: 14.1 g/dL (ref 12.0–16.0)
Lymphocytes Relative: 32 %
Lymphs Abs: 1.7 10*3/uL (ref 1.0–3.6)
MCH: 30.9 pg (ref 26.0–34.0)
MCHC: 34.1 g/dL (ref 32.0–36.0)
MCV: 90.6 fL (ref 80.0–100.0)
Monocytes Absolute: 0.4 10*3/uL (ref 0.2–0.9)
Monocytes Relative: 8 %
Neutro Abs: 3 10*3/uL (ref 1.4–6.5)
Neutrophils Relative %: 56 %
Platelets: 135 10*3/uL — ABNORMAL LOW (ref 150–440)
RBC: 4.57 MIL/uL (ref 3.80–5.20)
RDW: 13.8 % (ref 11.5–14.5)
WBC: 5.2 10*3/uL (ref 3.6–11.0)

## 2018-01-04 LAB — COMPREHENSIVE METABOLIC PANEL
ALT: 15 U/L (ref 14–54)
AST: 22 U/L (ref 15–41)
Albumin: 3.9 g/dL (ref 3.5–5.0)
Alkaline Phosphatase: 50 U/L (ref 38–126)
Anion gap: 6 (ref 5–15)
BUN: 16 mg/dL (ref 6–20)
CO2: 26 mmol/L (ref 22–32)
Calcium: 8.9 mg/dL (ref 8.9–10.3)
Chloride: 104 mmol/L (ref 101–111)
Creatinine, Ser: 0.82 mg/dL (ref 0.44–1.00)
GFR calc Af Amer: >60 mL/min (ref >60)
GFR calc non Af Amer: >60 mL/min (ref >60)
Glucose, Bld: 101 mg/dL — ABNORMAL HIGH (ref 65–99)
Potassium: 3.9 mmol/L (ref 3.5–5.1)
Sodium: 136 mmol/L (ref 135–145)
Total Bilirubin: 0.5 mg/dL (ref 0.3–1.2)
Total Protein: 6.5 g/dL (ref 6.5–8.1)

## 2018-01-04 NOTE — Progress Notes (Signed)
Patient offers no complaints today. 

## 2018-01-04 NOTE — Progress Notes (Signed)
[facility] day:  01/04/2018  Chief Complaint: Brittney Chaney is a 84 y.o. female with stage I right breast cancer who is seen for 6 month assessment.  HPI:  The patient was last seen in the medical oncology clinic on 07/05/2017.  At that time, she denied any complaint.  Exam revealed stable post-operative and post radiation changes in the right breast.  She declined BCI testing.  She planned to discontinue Arimidex after her pills ran out.  We discussed her osteopenia.  She declined Prolia.  During the interim, she has felt "fine".  She denies any breast concerns.  She denies any pain.  She states that she has pills through 01/2018.   Past Medical History:  Diagnosis Date  . Breast cancer [facility]) 2013   right breast with radiation  . Cancer of right breast, stage 1, estrogen receptor positive ([place]) 2013  . Uterine cancer ([place]) 2010    Past Surgical History:  Procedure Laterality Date  . BREAST BIOPSY Right 03/19/2015   US guided biopsy - negative fat necrosis  . BREAST LUMPECTOMY Right 2013   breast can with rad    History reviewed. No pertinent family history.  Social History:  reports that  has never smoked. she has never used smokeless tobacco. Her alcohol and drug histories are not on file.  She states that she lost her husband on 02/20/2016.  She lives in [place].  Her daughter has lived at [place] for 18 years.  She visits her 3 times a week.  The patient does not drive.  She was brought by a neighbor today.  The patient is alone today in clinic.  Allergies: No Known Allergies  Current Medications: Current Outpatient Medications  Medication Sig Dispense Refill  . acetaminophen (TYLENOL) 500 MG tablet Take by mouth.    . ALPRAZolam (XANAX) 0.5 MG tablet Take by mouth.    Marland Kitchen anastrozole (ARIMIDEX) 1 MG tablet Take 1 tablet (1 mg total) by mouth daily. 90 tablet 3  . aspirin EC 81 MG tablet Take 81 mg by mouth.    Marland Kitchen atenolol  (TENORMIN) 50 MG tablet Take 50 mg by mouth.    . calcium carbonate (CALCIUM 600) 600 MG TABS tablet Take 600 mg by mouth.    . enalapril (VASOTEC) 20 MG tablet Take 20 mg by mouth.    . Multiple Vitamins-Minerals (CENTRUM SILVER PO) Take by mouth.    Marland Kitchen omeprazole (PRILOSEC) 20 MG capsule Take 20 mg by mouth.    . simvastatin (ZOCOR) 40 MG tablet Take 40 mg by mouth.     No current facility-administered medications for this visit.     Review of Systems:  GENERAL:  Feels "fine".  No fevers or sweats.  Weight up 3 pounds. PERFORMANCE STATUS (ECOG):  1 HEENT:  No visual changes, runny nose, sore throat, mouth sores or tenderness. Lungs: No shortness of breath or cough.  No hemoptysis. Cardiac:  No chest pain, palpitations, orthopnea, or PND. GI:  No nausea, vomiting, diarrhea, constipation, melena or hematochezia. GU:  No urgency, frequency, dysuria, or hematuria. Musculoskeletal:  No back pain.  No joint pain.  No muscle tenderness. Extremities:  No pain or swelling. Skin:  No rashes or skin changes. Neuro:  No headache, numbness or weakness, balance or coordination issues. Endocrine:  No diabetes, thyroid issues, hot flashes or night sweats. Psych:  No mood changes, depression or anxiety. Pain:  No focal pain. Review of systems:  All other systems reviewed and found to be negative.  Physical Exam: Blood pressure (!) 168/82, pulse 76, temperature 97.6 F (36.4 C), temperature source Tympanic, resp. rate 18, weight 153 lb 3.5 oz (69.5 kg). GENERAL:  Well developed, well nourished, elderly woman sitting comfortably in the exam room in no acute distress. MENTAL STATUS:  Alert and oriented to person, place and time. HEAD:  Short gray hair.  Normocephalic, atraumatic, face symmetric, no Cushingoid features. EYES:  Oval glasses.  Gray/blue eyes.  Pupils equal round and reactive to light and accomodation.  No conjunctivitis or scleral icterus. ENT:  Oropharynx clear without lesion.  Tongue  normal. Mucous membranes moist.  RESPIRATORY:  Clear to auscultation without rales, wheezes or rhonchi. CARDIOVASCULAR:  Regular rate and rhythm without murmur, rub or gallop. BREAST:  Right breast with 3 cm area of post-operative and Mammosite scarring at the 11:00 position, stable.  No discrete masses, skin changes or nipple discharge.  Left breast with fibrocystic changes superiorly without masses, skin changes or nipple discharge.  ABDOMEN:  Soft, non-tender, with active bowel sounds, and no hepatosplenomegaly.  No masses. SKIN:  No rashes, ulcers or lesions. EXTREMITIES:  No edema, skin discoloration or tenderness.  No palpable cords. LYMPH NODES: No palpable cervical, supraclavicular, axillary or inguinal adenopathy  NEUROLOGICAL: Unremarkable. PSYCH:  Appropriate.   Appointment on 01/04/2018  Component Date Value Ref Range Status  . Sodium 01/04/2018 136  135 - 145 mmol/L Final  . Potassium 01/04/2018 3.9  3.5 - 5.1 mmol/L Final  . Chloride 01/04/2018 104  101 - 111 mmol/L Final  . CO2 01/04/2018 26  22 - 32 mmol/L Final  . Glucose, Bld 01/04/2018 101* 65 - 99 mg/dL Final  . BUN 01/04/2018 16  6 - 20 mg/dL Final  . Creatinine, Ser 01/04/2018 0.82  0.44 - 1.00 mg/dL Final  . Calcium 01/04/2018 8.9  8.9 - 10.3 mg/dL Final  . Total Protein 01/04/2018 6.5  6.5 - 8.1 g/dL Final  . Albumin 01/04/2018 3.9  3.5 - 5.0 g/dL Final  . AST 01/04/2018 22  15 - 41 U/L Final  . ALT 01/04/2018 15  14 - 54 U/L Final  . Alkaline Phosphatase 01/04/2018 50  38 - 126 U/L Final  . Total Bilirubin 01/04/2018 0.5  0.3 - 1.2 mg/dL Final  . GFR calc non Af Amer 01/04/2018 >60  >60 mL/min Final  . GFR calc Af Amer 01/04/2018 >60  >60 mL/min Final   Comment: (NOTE) The eGFR has been calculated using the CKD EPI equation. This calculation has not been validated in all clinical situations. eGFR's persistently <60 mL/min signify possible Chronic Kidney Disease.   Georgiann Hahn gap 01/04/2018 6  5 - 15 Final    Performed at [facility], [address], [place],  [address]  . WBC 01/04/2018 5.2  3.6 - 11.0 K/uL Final  . RBC 01/04/2018 4.57  3.80 - 5.20 MIL/uL Final  . Hemoglobin 01/04/2018 14.1  12.0 - 16.0 g/dL Final  . HCT 01/04/2018 41.4  35.0 - 47.0 % Final  . MCV 01/04/2018 90.6  80.0 - 100.0 fL Final  . MCH 01/04/2018 30.9  26.0 - 34.0 pg Final  . MCHC 01/04/2018 34.1  32.0 - 36.0 g/dL Final  . RDW 01/04/2018 13.8  11.5 - 14.5 % Final  . Platelets 01/04/2018 135* 150 - 440 K/uL Final  . Neutrophils Relative % 01/04/2018 56  % Final  . Neutro Abs 01/04/2018 3.0  1.4 -  6.5 K/uL Final  . Lymphocytes Relative 01/04/2018 32  % Final  . Lymphs Abs 01/04/2018 1.7  1.0 - 3.6 K/uL Final  . Monocytes Relative 01/04/2018 8  % Final  . Monocytes Absolute 01/04/2018 0.4  0.2 - 0.9 K/uL Final  . Eosinophils Relative 01/04/2018 3  % Final  . Eosinophils Absolute 01/04/2018 0.1  0 - 0.7 K/uL Final  . Basophils Relative 01/04/2018 1  % Final  . Basophils Absolute 01/04/2018 0.0  0 - 0.1 K/uL Final   Performed at [facility], [address] [place], [place] [address]  . CA 27.29 01/04/2018 15.0  0.0 - 38.6 U/mL Final   Comment: (NOTE) Siemens Centaur Immunochemiluminometric Methodology [facility]) Values obtained with different assay methods or kits cannot be used interchangeably. Results cannot be interpreted as absolute evidence of the presence or absence of malignant disease. Performed At: [facility] [place], [place] [id number] Rush Farmer MD LK:[id number] Performed at [facility], [address] [place], [place] [address]     Assessment:  CADI RHINEHART is a 84 y.o. female with stage I right breast cancer s/p wide excision and sentinel lymph node biopsy on 05/02/2012.  Pathology revealed a 1.5 cm grade I invasive lobular carcinoma in the right upper outer quadrant.  Tumor was ER positive (> 90%), PR positive (> 90%) and  Her2/neu negative  She received accelerated partial breast radiation (Mammosite).  She began Arimidex in 05/2012.   Bilateral mammogram on 03/16/2017 revealed no evidence of malignancy.  CA27.29 has been followed:  17.4 on 11/14/2012, 19.0 on 05/15/2013, 14.6 on 11/13/2013, 21.2 on 07/05/2016, 21.8 on 01/04/2017, 14.6 on 07/05/2017, and 15 on 01/04/2018.  Bone density on 09/19/2014 revealed osteopenia with a T-score of -1.4 in L1-L4.  Bone density on 03/16/2017 revealed osteopenia with a - score of -1.8 in the right femoral neck and -0.9 in the AP spine L1-L4.  She takes calcium and vitamin D.  She declined Prolia.  Symptomatically, she denies any complaint.  Exam reveals stable post-operative and post radiation changes in the right breast.  Plan: 1.  Labs today:  CBC with diff, CMP, CA27.29. 2.  Discuss prior conversation of BCI testing.  Patient declined.  Discuss that she has completed 5 years of hormonal therapy.  She wishes to complete her current bottle.  Discuss annual follow-up. 3.  Complete Arimidex in 01/2018 (current bottle). 4.  Bilateral mammogram 03/16/2018. 5.  RTC in 1 year for MD assessment, labs (CBC with diff, CMP, CA27.29) and review of mammogram   Lequita Asal, MD  01/04/2018, 5:35 PM

## 2018-01-05 LAB — CANCER ANTIGEN 27.29: CA 27.29: 15 U/mL (ref 0.0–38.6)

## 2018-01-08 ENCOUNTER — Encounter: Payer: Self-pay | Admitting: Hematology and Oncology

## 2018-03-20 ENCOUNTER — Ambulatory Visit
Admission: RE | Admit: 2018-03-20 | Discharge: 2018-03-20 | Disposition: A | Discharge status: Home / Self Care | Payer: Medicare Other | Source: Ambulatory Visit | Attending: Hematology and Oncology | Admitting: Hematology and Oncology

## 2018-03-20 DIAGNOSIS — Z853 Personal history of malignant neoplasm of breast: Secondary | ICD-10-CM | POA: Diagnosis not present

## 2018-03-20 DIAGNOSIS — C50911 Malignant neoplasm of unspecified site of right female breast: Secondary | ICD-10-CM

## 2018-03-20 DIAGNOSIS — Z170 Estrogen receptor positive status [ER+]: Secondary | ICD-10-CM

## 2018-03-20 HISTORY — DX: Personal history of irradiation: Z92.3

## 2019-01-03 ENCOUNTER — Other Ambulatory Visit: Payer: BLUE CROSS/BLUE SHIELD

## 2019-01-03 ENCOUNTER — Ambulatory Visit: Payer: BLUE CROSS/BLUE SHIELD | Admitting: Hematology and Oncology

## 2019-02-16 ENCOUNTER — Other Ambulatory Visit: Payer: Self-pay

## 2019-02-16 DIAGNOSIS — Z170 Estrogen receptor positive status [ER+]: Secondary | ICD-10-CM

## 2019-02-16 DIAGNOSIS — Z853 Personal history of malignant neoplasm of breast: Secondary | ICD-10-CM

## 2019-02-16 DIAGNOSIS — M8580 Other specified disorders of bone density and structure, unspecified site: Secondary | ICD-10-CM

## 2019-02-16 DIAGNOSIS — C50911 Malignant neoplasm of unspecified site of right female breast: Secondary | ICD-10-CM

## 2019-02-21 ENCOUNTER — Other Ambulatory Visit: Payer: BLUE CROSS/BLUE SHIELD

## 2019-02-21 ENCOUNTER — Ambulatory Visit: Payer: BLUE CROSS/BLUE SHIELD | Admitting: Hematology and Oncology

## 2019-03-15 ENCOUNTER — Other Ambulatory Visit: Payer: Medicare HMO

## 2019-03-15 ENCOUNTER — Ambulatory Visit: Payer: Self-pay | Admitting: Hematology and Oncology

## 2019-06-26 NOTE — Progress Notes (Signed)
[facility]  [address] [place], [place] [address] Phone: [phone]  Fax: [phone]   Clinic Day:  06/27/2019  Referring physician: Idelle Crouch, MD  Chief Complaint: Brittney Chaney is a 85 y.o. female with stage I right breast cancer who is seen for 1.5 year assessment.  HPI: The patient was last seen in the medical oncology clinic on 01/04/2018.  At that time, she denied any complaints.  Exam revealed stable post-operative and post radiation changes in the right breast.  CA27.29 was 15.0.  She completed Arimidex therapy in 06/2018.   Bilateral diagnostic mammogram on 03/20/2018 revealed no evidence of malignancy and lumpectomy changes in the right breast.   During the interim, she has been "alright."  She denies any fevers, sweats, or weight loss. She denies any lumps or bumps. She denies any bone or joint pain. She denies any nausea, vomiting, or diarrhea.   At the beginning of 02/2019, she noticed a lump in her right breast that has not changed since that time.    Past Medical History:  Diagnosis Date  . Breast cancer [facility]) 2013   right breast with radiation  . Personal history of radiation therapy    f/u breast cancer   . Uterine cancer ([place]) 2010    Past Surgical History:  Procedure Laterality Date  . BREAST BIOPSY Right 03/19/2015   US guided biopsy - negative fat necrosis  . BREAST BIOPSY Right 2013   INVASIVE LOBULAR CARCINOMA, NOTTINGHAM COMBINED HISTOLOGIC GRADE 1.   . BREAST LUMPECTOMY Right 2013   breast cancer with rad    History reviewed. No pertinent family history.  Social History:  reports that she has never smoked. She has never used smokeless tobacco. No history on file for alcohol and drug. She states that she lost her husband on March 16, 2016 and her daughter passed away in 2016/12/06 at 38 yo. She lives in [place]. The patient does not drive. Her son brought her to the clinic today. The patient is alone today  in clinic.  Allergies: No Known Allergies  Current Medications: Current Outpatient Medications  Medication Sig Dispense Refill  . acetaminophen (TYLENOL) 500 MG tablet Take 500 mg by mouth every 6 (six) hours as needed.     Marland Kitchen aspirin EC 81 MG tablet Take 81 mg by mouth daily.     Marland Kitchen atenolol (TENORMIN) 50 MG tablet Take 50 mg by mouth daily.     . calcium carbonate (CALCIUM 600) 600 MG TABS tablet Take 600 mg by mouth.    . Calcium Carbonate-Vitamin D3 600-400 MG-UNIT TABS Take 2 capsules by mouth daily.     . enalapril (VASOTEC) 20 MG tablet Take 20 mg by mouth daily.     . Multiple Vitamins-Minerals (CENTRUM SILVER PO) Take by mouth.    Marland Kitchen omeprazole (PRILOSEC) 20 MG capsule Take 20 mg by mouth daily.     . simvastatin (ZOCOR) 40 MG tablet Take 40 mg by mouth daily at 6 PM.     . ALPRAZolam (XANAX) 0.5 MG tablet Take 0.5 mg by mouth daily as needed.     Marland Kitchen anastrozole (ARIMIDEX) 1 MG tablet Take 1 tablet (1 mg total) by mouth daily. (Patient not taking: Reported on 06/27/2019) 90 tablet 3   No current facility-administered medications for this visit.     Review of Systems  Constitutional: Positive for weight loss (2lbs). Negative for chills, diaphoresis, fever and malaise/fatigue.       Feels "alright".  HENT: Negative.  Negative for congestion, hearing loss, sinus pain and sore throat.   Eyes: Negative.  Negative for blurred vision.  Respiratory: Negative.  Negative for cough, shortness of breath and wheezing.   Cardiovascular: Positive for leg swelling (BLE). Negative for chest pain, palpitations, claudication and PND.  Gastrointestinal: Negative.  Negative for abdominal pain, blood in stool, constipation, diarrhea, melena, nausea and vomiting.  Genitourinary: Negative.  Negative for dysuria, frequency, hematuria and urgency.  Musculoskeletal: Negative.  Negative for back pain, joint pain and myalgias.  Skin: Negative.  Negative for rash.  Neurological: Negative.  Negative for  dizziness, tingling, sensory change, weakness and headaches.  Endo/Heme/Allergies: Negative.  Does not bruise/bleed easily.  Psychiatric/Behavioral: Negative.  Negative for depression, memory loss and substance abuse. The patient is not nervous/anxious and does not have insomnia.   All other systems reviewed and are negative.  Performance status (ECOG): 0  Vitals Blood pressure (!) 182/79, pulse 82, temperature 98.1 F (36.7 C), temperature source Tympanic, resp. rate 17, weight 151 lb 12.6 oz (68.9 kg), SpO2 98 %.   Physical Exam  Constitutional: She is oriented to person, place, and time. She appears well-developed and well-nourished. No distress.  HENT:  Head: Normocephalic and atraumatic.  Mouth/Throat: Oropharynx is clear and moist. No oropharyngeal exudate.  Short gray hair.  Mask.  Eyes: Pupils are equal, round, and reactive to light. Conjunctivae and EOM are normal. No scleral icterus.  Oval glasses.  Gray/blue eyes.   Neck: Normal range of motion. Neck supple.  Cardiovascular: Normal rate, regular rhythm and normal heart sounds.  No murmur heard. Pulmonary/Chest: Effort normal and breath sounds normal. No respiratory distress. She has no wheezes. Right breast exhibits mass (11-12:00 superior margin of scar) and skin change (3 cm area of post-operative and Mammosite scarring at the 11:00 position, stable). Right breast exhibits no inverted nipple, no nipple discharge and no tenderness. Left breast exhibits skin change (fibrocystic changes superiorly). Left breast exhibits no inverted nipple, no mass, no nipple discharge and no tenderness.  Abdominal: Soft. Bowel sounds are normal. She exhibits no distension. There is no abdominal tenderness.  Musculoskeletal: Normal range of motion.        General: Edema (BLE) present.  Lymphadenopathy:    She has no cervical adenopathy.    She has no axillary adenopathy.       Right: No supraclavicular adenopathy present.       Left: No  supraclavicular adenopathy present.  Neurological: She is alert and oriented to person, place, and time.  Skin: Skin is warm and dry. She is not diaphoretic. No erythema.  Psychiatric: She has a normal mood and affect. Her behavior is normal. Judgment and thought content normal.  Nursing note and vitals reviewed.    Appointment on 06/27/2019  Component Date Value Ref Range Status  . Sodium 06/27/2019 133* 135 - 145 mmol/L Final  . Potassium 06/27/2019 4.0  3.5 - 5.1 mmol/L Final  . Chloride 06/27/2019 100  98 - 111 mmol/L Final  . CO2 06/27/2019 21* 22 - 32 mmol/L Final  . Glucose, Bld 06/27/2019 141* 70 - 99 mg/dL Final  . BUN 06/27/2019 17  8 - 23 mg/dL Final  . Creatinine, Ser 06/27/2019 0.94  0.44 - 1.00 mg/dL Final  . Calcium 06/27/2019 9.3  8.9 - 10.3 mg/dL Final  . Total Protein 06/27/2019 6.9  6.5 - 8.1 g/dL Final  . Albumin 06/27/2019 4.2  3.5 - 5.0 g/dL Final  . AST 06/27/2019 24  15 - 41 U/L Final  . ALT 06/27/2019 17  0 - 44 U/L Final  . Alkaline Phosphatase 06/27/2019 45  38 - 126 U/L Final  . Total Bilirubin 06/27/2019 0.8  0.3 - 1.2 mg/dL Final  . GFR calc non Af Amer 06/27/2019 54* >60 mL/min Final  . GFR calc Af Amer 06/27/2019 >60  >60 mL/min Final  . Anion gap 06/27/2019 12  5 - 15 Final   Performed at [facility], [address], [place], [place] [address]  . WBC 06/27/2019 6.7  4.0 - 10.5 K/uL Final  . RBC 06/27/2019 4.75  3.87 - 5.11 MIL/uL Final  . Hemoglobin 06/27/2019 14.5  12.0 - 15.0 g/dL Final  . HCT 06/27/2019 43.1  36.0 - 46.0 % Final  . MCV 06/27/2019 90.7  80.0 - 100.0 fL Final  . MCH 06/27/2019 30.5  26.0 - 34.0 pg Final  . MCHC 06/27/2019 33.6  30.0 - 36.0 g/dL Final  . RDW 06/27/2019 12.6  11.5 - 15.5 % Final  . Platelets 06/27/2019 132* 150 - 400 K/uL Final  . nRBC 06/27/2019 0.0  0.0 - 0.2 % Final  . Neutrophils Relative % 06/27/2019 65  % Final  . Neutro Abs 06/27/2019 4.3  1.7 - 7.7 K/uL Final  . Lymphocytes Relative  06/27/2019 26  % Final  . Lymphs Abs 06/27/2019 1.7  0.7 - 4.0 K/uL Final  . Monocytes Relative 06/27/2019 7  % Final  . Monocytes Absolute 06/27/2019 0.4  0.1 - 1.0 K/uL Final  . Eosinophils Relative 06/27/2019 1  % Final  . Eosinophils Absolute 06/27/2019 0.1  0.0 - 0.5 K/uL Final  . Basophils Relative 06/27/2019 1  % Final  . Basophils Absolute 06/27/2019 0.0  0.0 - 0.1 K/uL Final  . Immature Granulocytes 06/27/2019 0  % Final  . Abs Immature Granulocytes 06/27/2019 0.02  0.00 - 0.07 K/uL Final   Performed at [facility], [address] [place], [place] [address]    Assessment:  Brittney Chaney is a 85 y.o. female with stage I right breast cancer s/p wide excision and sentinel lymph node biopsy on 05/02/2012.  Pathology revealed a 1.5 cm grade I invasive lobular carcinoma in the right upper outer quadrant.  Tumor was ER positive (> 90%), PR positive (> 90%) and Her2/neu negative  She received accelerated partial breast radiation (Mammosite).  She began Arimidex in 05/2012, completed 06/2018.   Bilateral mammogram on 03/16/2017 revealed no evidence of malignancy.  CA27.29 has been followed:  17.4 on 11/14/2012, 19.0 on 05/15/2013, 14.6 on 11/13/2013, 21.2 on 07/05/2016, 21.8 on 01/04/2017, 14.6 on 07/05/2017, and 15 on 01/04/2018.  Bone density on 09/19/2014 revealed osteopenia with a T-score of -1.4 in L1-L4.  Bone density on 03/16/2017 revealed osteopenia with a T-score of -1.8 in the right femoral neck and -0.9 in the AP spine L1-L4.  She takes calcium and vitamin D.  She declined Prolia.  Symptomatically, she is doing well.  She has noted a lump near the margin of her scar for the past 4 months.  Exam reveals postoperative changes in the right breast with a fingertip nodule at the superior margin of the scar medially.  Plan: 1.   Labs today:  CBC with diff, CMP, CA27.29. 2.   Stage I right breast cancer  Patient completed Arimidex in 06/2018.  Clinically she notes a  lump near her scar.  Exam reveals a fingertip nodule at the superior medial scar margin.  Bilateral diagnostic mammogram and right breast ultrasound 3.   Osteopenia  Patient continues calcium and vitamin D. 4.   RTC (doximity or phone) after mammogram. 5.   RTC in 1 year for MD assessment and labs (CBC with diff, CMP, CA27.29).   I discussed the assessment and treatment plan with the patient.  The patient was provided an opportunity to ask questions and all were answered.  The patient agreed with the plan and demonstrated an understanding of the instructions.  The patient was advised to call back if the symptoms worsen or if the condition fails to improve as anticipated.   Lequita Asal, MD, PhD    06/27/2019, 2:04 PM  I, Molly Dorshimer, am acting as Education administrator for [employer]. Mike Gip, MD, PhD.  I, Melissa C. Mike Gip, MD, have reviewed the above documentation for accuracy and completeness, and I agree with the above.

## 2019-06-27 ENCOUNTER — Inpatient Hospital Stay: Payer: Medicare HMO | Attending: Hematology and Oncology

## 2019-06-27 ENCOUNTER — Encounter: Payer: Self-pay | Admitting: Hematology and Oncology

## 2019-06-27 ENCOUNTER — Other Ambulatory Visit: Payer: Self-pay

## 2019-06-27 ENCOUNTER — Inpatient Hospital Stay (HOSPITAL_BASED_OUTPATIENT_CLINIC_OR_DEPARTMENT_OTHER): Payer: Medicare HMO | Admitting: Hematology and Oncology

## 2019-06-27 VITALS — BP 182/79 | HR 82 | Temp 98.1°F | Resp 17 | Wt 151.8 lb

## 2019-06-27 DIAGNOSIS — C50911 Malignant neoplasm of unspecified site of right female breast: Secondary | ICD-10-CM

## 2019-06-27 DIAGNOSIS — M8580 Other specified disorders of bone density and structure, unspecified site: Secondary | ICD-10-CM

## 2019-06-27 DIAGNOSIS — Z8542 Personal history of malignant neoplasm of other parts of uterus: Secondary | ICD-10-CM

## 2019-06-27 DIAGNOSIS — N6310 Unspecified lump in the right breast, unspecified quadrant: Secondary | ICD-10-CM

## 2019-06-27 DIAGNOSIS — Z923 Personal history of irradiation: Secondary | ICD-10-CM | POA: Insufficient documentation

## 2019-06-27 DIAGNOSIS — Z7982 Long term (current) use of aspirin: Secondary | ICD-10-CM | POA: Diagnosis not present

## 2019-06-27 DIAGNOSIS — Z170 Estrogen receptor positive status [ER+]: Secondary | ICD-10-CM

## 2019-06-27 DIAGNOSIS — Z79899 Other long term (current) drug therapy: Secondary | ICD-10-CM

## 2019-06-27 DIAGNOSIS — Z853 Personal history of malignant neoplasm of breast: Secondary | ICD-10-CM | POA: Insufficient documentation

## 2019-06-27 LAB — COMPREHENSIVE METABOLIC PANEL
ALT: 17 U/L (ref 0–44)
AST: 24 U/L (ref 15–41)
Albumin: 4.2 g/dL (ref 3.5–5.0)
Alkaline Phosphatase: 45 U/L (ref 38–126)
Anion gap: 12 (ref 5–15)
BUN: 17 mg/dL (ref 8–23)
CO2: 21 mmol/L — ABNORMAL LOW (ref 22–32)
Calcium: 9.3 mg/dL (ref 8.9–10.3)
Chloride: 100 mmol/L (ref 98–111)
Creatinine, Ser: 0.94 mg/dL (ref 0.44–1.00)
GFR calc Af Amer: >60 mL/min (ref >60)
GFR calc non Af Amer: 54 mL/min — ABNORMAL LOW (ref >60)
Glucose, Bld: 141 mg/dL — ABNORMAL HIGH (ref 70–99)
Potassium: 4 mmol/L (ref 3.5–5.1)
Sodium: 133 mmol/L — ABNORMAL LOW (ref 135–145)
Total Bilirubin: 0.8 mg/dL (ref 0.3–1.2)
Total Protein: 6.9 g/dL (ref 6.5–8.1)

## 2019-06-27 LAB — CBC WITH DIFFERENTIAL/PLATELET
Abs Immature Granulocytes: 0.02 10*3/uL (ref 0.00–0.07)
Basophils Absolute: 0 10*3/uL (ref 0.0–0.1)
Basophils Relative: 1 %
Eosinophils Absolute: 0.1 10*3/uL (ref 0.0–0.5)
Eosinophils Relative: 1 %
HCT: 43.1 % (ref 36.0–46.0)
Hemoglobin: 14.5 g/dL (ref 12.0–15.0)
Immature Granulocytes: 0 %
Lymphocytes Relative: 26 %
Lymphs Abs: 1.7 10*3/uL (ref 0.7–4.0)
MCH: 30.5 pg (ref 26.0–34.0)
MCHC: 33.6 g/dL (ref 30.0–36.0)
MCV: 90.7 fL (ref 80.0–100.0)
Monocytes Absolute: 0.4 10*3/uL (ref 0.1–1.0)
Monocytes Relative: 7 %
Neutro Abs: 4.3 10*3/uL (ref 1.7–7.7)
Neutrophils Relative %: 65 %
Platelets: 132 10*3/uL — ABNORMAL LOW (ref 150–400)
RBC: 4.75 MIL/uL (ref 3.87–5.11)
RDW: 12.6 % (ref 11.5–15.5)
WBC: 6.7 10*3/uL (ref 4.0–10.5)
nRBC: 0 % (ref 0.0–0.2)

## 2019-06-27 NOTE — Progress Notes (Signed)
Pt here for follow up. Denies any concerns.  

## 2019-06-28 LAB — CANCER ANTIGEN 27.29: CA 27.29: 14.6 U/mL (ref 0.0–38.6)

## 2019-07-03 ENCOUNTER — Ambulatory Visit
Admission: RE | Admit: 2019-07-03 | Discharge: 2019-07-03 | Disposition: A | Discharge status: Home / Self Care | Payer: Medicare HMO | Source: Ambulatory Visit | Attending: Hematology and Oncology | Admitting: Hematology and Oncology

## 2019-07-03 DIAGNOSIS — N6311 Unspecified lump in the right breast, upper outer quadrant: Secondary | ICD-10-CM | POA: Diagnosis not present

## 2019-07-03 DIAGNOSIS — C50911 Malignant neoplasm of unspecified site of right female breast: Secondary | ICD-10-CM

## 2019-07-03 DIAGNOSIS — Z853 Personal history of malignant neoplasm of breast: Secondary | ICD-10-CM | POA: Insufficient documentation

## 2019-07-03 NOTE — Progress Notes (Signed)
[facility]  [address] [place], [place] [address] Phone: [phone]  Fax: [phone]   Telephone Visit:  07/05/2019  Referring physician: Idelle Crouch, MD  I connected with [place] on 07/05/2019 at 3:46 PM by telephone conferencing and verified that I was speaking with the correct person using 2 identifiers.  The patient was at home.  I discussed the limitations, risk, security and privacy concerns of performing an evaluation and management service by telephone conferencing and the availability of in person appointments.  I also discussed with the patient that there may be a patient responsible charge related to this service.  The patient expressed understanding and agreed to proceed.   Chief Complaint: Brittney Chaney is a 85 y.o. female  with stage I right breast cancer who is seen for 1 week assessment.  HPI: The patient was last seen in the medical oncology clinic on 06/27/2019. At that time, she was doing well.  She had noted a lump near the margin of her scar for the past 4 months. Exam revealed postoperative changes in the right breast with a fingertip nodule at the superior margin of the scar medially. She continued vitamin D and calcium. Platelets 132,000. Sodium 133. CA 27.29 was normal.  Bilateral breast mammogram and right breast axilla ultrasound on 07/03/2019 revealed no malignancy in either breast.  There were stable postsurgical and posttreatment changes in the right breast.  During the interim, the patient is doing good. She notes her weight is stable at 150 pounds. The edema in her legs has improved.   Past Medical History:  Diagnosis Date  . Breast cancer [facility]) 2013   right breast with radiation  . Personal history of radiation therapy    f/u breast cancer   . Uterine cancer ([place]) 2010    Past Surgical History:  Procedure Laterality Date  . BREAST BIOPSY Right 03/19/2015   US guided biopsy - negative fat necrosis   . BREAST BIOPSY Right 2013   INVASIVE LOBULAR CARCINOMA, NOTTINGHAM COMBINED HISTOLOGIC GRADE 1.   . BREAST LUMPECTOMY Right 2013   breast cancer with rad  . EXCISION / BIOPSY BREAST / NIPPLE / DUCT Right ?   benign    No family history on file.  Social History:  reports that she has never smoked. She has never used smokeless tobacco. No history on file for alcohol and drug. She states that she lost her husband on 2016/03/12 and her daughter passed away in 12/02/16 at 79 yo.She lives in [place] patient does not drive. Her son brought her to the clinic today. The patient is alone today.  Participants in the patient's visit and their role in the encounter included the patient, and Waymon Budge, RN today.  The intake visit was provided by Waymon Budge, RN.  Allergies: No Known Allergies  Current Medications: Current Outpatient Medications  Medication Sig Dispense Refill  . acetaminophen (TYLENOL) 500 MG tablet Take 500 mg by mouth every 6 (six) hours as needed.     . ALPRAZolam (XANAX) 0.5 MG tablet Take 0.5 mg by mouth daily as needed.     Marland Kitchen aspirin EC 81 MG tablet Take 81 mg by mouth daily.     Marland Kitchen atenolol (TENORMIN) 50 MG tablet Take 50 mg by mouth daily.     . calcium carbonate (CALCIUM 600) 600 MG TABS tablet Take 600 mg by mouth.    . Calcium Carbonate-Vitamin D3 600-400 MG-UNIT TABS Take 2 capsules by  mouth daily.     . enalapril (VASOTEC) 20 MG tablet Take 20 mg by mouth daily.     . Multiple Vitamins-Minerals (CENTRUM SILVER PO) Take by mouth.    Marland Kitchen omeprazole (PRILOSEC) 20 MG capsule Take 20 mg by mouth daily.     . simvastatin (ZOCOR) 40 MG tablet Take 40 mg by mouth daily at 6 PM.     . anastrozole (ARIMIDEX) 1 MG tablet Take 1 tablet (1 mg total) by mouth daily. (Patient not taking: Reported on 07/05/2019) 90 tablet 3   No current facility-administered medications for this visit.      Review of Systems  Constitutional: Negative.  Negative for chills,  diaphoresis, fever, malaise/fatigue and weight loss (stable at 150 lbs).       Doing well.  HENT: Negative.  Negative for congestion, hearing loss, sinus pain and sore throat.   Eyes: Negative.  Negative for blurred vision and double vision.  Respiratory: Negative.  Negative for cough, shortness of breath and wheezing.   Cardiovascular: Negative.  Negative for chest pain, palpitations, claudication, leg swelling (BLE, improved) and PND.  Gastrointestinal: Negative.  Negative for abdominal pain, blood in stool, constipation, diarrhea, melena, nausea and vomiting.  Genitourinary: Negative.  Negative for dysuria, frequency, hematuria and urgency.  Musculoskeletal: Negative.  Negative for back pain, joint pain and myalgias.  Skin: Negative.  Negative for rash.  Neurological: Negative.  Negative for dizziness, tingling, sensory change, weakness and headaches.  Endo/Heme/Allergies: Negative.  Does not bruise/bleed easily.  Psychiatric/Behavioral: Negative.  Negative for depression, memory loss and substance abuse. The patient is not nervous/anxious and does not have insomnia.   All other systems reviewed and are negative.   Performance status (ECOG): 0  No visits with results within 3 Day(s) from this visit.  Latest known visit with results is:  Appointment on 06/27/2019  Component Date Value Ref Range Status  . CA 27.29 06/27/2019 14.6  0.0 - 38.6 U/mL Final   Comment: (NOTE) Siemens Centaur Immunochemiluminometric Methodology [facility]) Values obtained with different assay methods or kits cannot be used interchangeably. Results cannot be interpreted as absolute evidence of the presence or absence of malignant disease. Performed At: [facility] [place], [place] [id number] Rush Farmer MD [id number]   . Sodium 06/27/2019 133* 135 - 145 mmol/L Final  . Potassium 06/27/2019 4.0  3.5 - 5.1 mmol/L Final  . Chloride 06/27/2019 100  98 - 111 mmol/L Final  . CO2  06/27/2019 21* 22 - 32 mmol/L Final  . Glucose, Bld 06/27/2019 141* 70 - 99 mg/dL Final  . BUN 06/27/2019 17  8 - 23 mg/dL Final  . Creatinine, Ser 06/27/2019 0.94  0.44 - 1.00 mg/dL Final  . Calcium 06/27/2019 9.3  8.9 - 10.3 mg/dL Final  . Total Protein 06/27/2019 6.9  6.5 - 8.1 g/dL Final  . Albumin 06/27/2019 4.2  3.5 - 5.0 g/dL Final  . AST 06/27/2019 24  15 - 41 U/L Final  . ALT 06/27/2019 17  0 - 44 U/L Final  . Alkaline Phosphatase 06/27/2019 45  38 - 126 U/L Final  . Total Bilirubin 06/27/2019 0.8  0.3 - 1.2 mg/dL Final  . GFR calc non Af Amer 06/27/2019 54* >60 mL/min Final  . GFR calc Af Amer 06/27/2019 >60  >60 mL/min Final  . Anion gap 06/27/2019 12  5 - 15 Final   Performed at [facility], [address], [place], [place] [address]  . WBC 06/27/2019  6.7  4.0 - 10.5 K/uL Final  . RBC 06/27/2019 4.75  3.87 - 5.11 MIL/uL Final  . Hemoglobin 06/27/2019 14.5  12.0 - 15.0 g/dL Final  . HCT 06/27/2019 43.1  36.0 - 46.0 % Final  . MCV 06/27/2019 90.7  80.0 - 100.0 fL Final  . MCH 06/27/2019 30.5  26.0 - 34.0 pg Final  . MCHC 06/27/2019 33.6  30.0 - 36.0 g/dL Final  . RDW 06/27/2019 12.6  11.5 - 15.5 % Final  . Platelets 06/27/2019 132* 150 - 400 K/uL Final  . nRBC 06/27/2019 0.0  0.0 - 0.2 % Final  . Neutrophils Relative % 06/27/2019 65  % Final  . Neutro Abs 06/27/2019 4.3  1.7 - 7.7 K/uL Final  . Lymphocytes Relative 06/27/2019 26  % Final  . Lymphs Abs 06/27/2019 1.7  0.7 - 4.0 K/uL Final  . Monocytes Relative 06/27/2019 7  % Final  . Monocytes Absolute 06/27/2019 0.4  0.1 - 1.0 K/uL Final  . Eosinophils Relative 06/27/2019 1  % Final  . Eosinophils Absolute 06/27/2019 0.1  0.0 - 0.5 K/uL Final  . Basophils Relative 06/27/2019 1  % Final  . Basophils Absolute 06/27/2019 0.0  0.0 - 0.1 K/uL Final  . Immature Granulocytes 06/27/2019 0  % Final  . Abs Immature Granulocytes 06/27/2019 0.02  0.00 - 0.07 K/uL Final   Performed at [facility],  [address], [place], [place] [address]    Assessment:  Brittney Chaney is a 85 y.o. female with stage I right breast cancers/p wide excision and sentinel lymph node biopsy on 05/02/2012. Pathology revealed a 1.5 cm grade I invasive lobular carcinoma in the right upper outer quadrant. Tumor was ER positive (>90%), PR positive (>90%) and Her2/neu negative  She received accelerated partial breast radiation (Mammosite). She began Arimidexin 05/2012, completed 06/2018.   Bilateral mammogramon 03/16/2017 revealed no evidence of malignancy.  Bilateral breast mammogram and right breast axilla ultrasound on 07/03/2019 revealed no malignancy in either breast.  There were stable postsurgical and posttreatment changes in the right breast.  CA27.29 has been followed: 17.4 on 11/14/2012, 19.0 on 05/15/2013, 14.6 on 11/13/2013, 21.2 on 07/05/2016, 21.8 on 01/04/2017, 14.6 on 07/05/2017, 15 on 01/04/2018, and 14.6 on 06/27/2019.  Bone densityon 09/19/2014 revealed osteopeniawith a T-score of -1.4 in L1-L4. Bone density on 03/16/2017 revealed osteopenia with a T-score of -1.8 in the right femoral neck and -0.9 in the AP spine L1-L4. She takes calcium and vitamin D. She declined Prolia.  Symptomatically, she is doing well.  She voices no concerns.  Plan: 1.   Stage I right breast cancer             She completed Arimidex in 06/2018.             Bilateral mammogram and right breast ultrasound on 07/03/2019 was reviewed.             There was no abnormality.  Findings are consistent with post-operative changes. 2.   Osteopenia             Continue calcium and vitamin D. 3.   RTC as previously scheduled in 1 year     I discussed the assessment and treatment plan with the patient.  The patient was provided an opportunity to ask questions and all were answered.  The patient agreed with the plan and demonstrated an understanding of the instructions.  The patient was advised to call back or seek an in  person evaluation if the symptoms worsen or if the condition fails to improve as anticipated.  I provided 5 minutes (3:46 PM - 3:51 PM) of non face-to-face video visit time during this this encounter and > 50% was spent counseling as documented under my assessment and plan.  I provided these services from the [facility] office.   Nolon Stalls, MD, PhD  07/05/2019, 3:46 PM  I, Selena Batten, am acting as scribe for [employer]. Mike Gip, MD, PhD.  I,  C. Mike Gip, MD, have reviewed the above documentation for accuracy and completeness, and I agree with the above.

## 2019-07-04 ENCOUNTER — Other Ambulatory Visit: Payer: Self-pay

## 2019-07-05 ENCOUNTER — Inpatient Hospital Stay (HOSPITAL_BASED_OUTPATIENT_CLINIC_OR_DEPARTMENT_OTHER): Payer: Medicare HMO | Admitting: Hematology and Oncology

## 2019-07-05 ENCOUNTER — Encounter: Payer: Self-pay | Admitting: Hematology and Oncology

## 2019-07-05 DIAGNOSIS — Z170 Estrogen receptor positive status [ER+]: Secondary | ICD-10-CM

## 2019-07-05 DIAGNOSIS — M8580 Other specified disorders of bone density and structure, unspecified site: Secondary | ICD-10-CM

## 2019-07-05 DIAGNOSIS — C50911 Malignant neoplasm of unspecified site of right female breast: Secondary | ICD-10-CM | POA: Diagnosis not present

## 2019-07-05 NOTE — Progress Notes (Signed)
Confirmed name, DOB, and address. Denies any concerns.  

## 2020-03-20 ENCOUNTER — Ambulatory Visit: Payer: Medicare HMO | Attending: Internal Medicine

## 2020-03-20 DIAGNOSIS — Z23 Encounter for immunization: Secondary | ICD-10-CM

## 2020-03-20 NOTE — Progress Notes (Signed)
   Covid-19 Vaccination Clinic  Name:  Brittney Chaney    MRN: [id number] DOB: 1929/04/15  03/20/2020  [name] was observed post Covid-19 immunization for 15 minutes without incident. She was provided with Vaccine Information Sheet and instruction to access the V-Safe system.   [name] was instructed to call 911 with any severe reactions post vaccine: Marland Kitchen Difficulty breathing  . Swelling of face and throat  . A fast heartbeat  . A bad rash all over body  . Dizziness and weakness   Immunizations Administered    Name Date Dose VIS Date Route   Pfizer COVID-19 Vaccine 03/20/2020 11:32 AM 0.3 mL 11/16/2019 Intramuscular   Manufacturer: [place]   Lot: [id number]   [place]: [id number]

## 2020-04-15 ENCOUNTER — Ambulatory Visit: Payer: Medicare HMO | Attending: Internal Medicine

## 2020-04-15 DIAGNOSIS — Z23 Encounter for immunization: Secondary | ICD-10-CM

## 2020-04-15 NOTE — Progress Notes (Signed)
   Covid-19 Vaccination Clinic  Name:  Brittney Chaney    MRN: [id number] DOB: [birth year]  04/15/2020  [name] was observed post Covid-19 immunization for 15 minutes without incident. She was provided with Vaccine Information Sheet and instruction to access the V-Safe system.   [name] was instructed to call 911 with any severe reactions post vaccine: Marland Kitchen Difficulty breathing  . Swelling of face and throat  . A fast heartbeat  . A bad rash all over body  . Dizziness and weakness   Immunizations Administered    Name Date Dose VIS Date Route   Pfizer COVID-19 Vaccine 04/15/2020 10:55 AM 0.3 mL 01/30/2019 Intramuscular   Manufacturer: [place]   Lot: [id number]   [place]: [id number]

## 2020-06-05 ENCOUNTER — Other Ambulatory Visit: Payer: Self-pay | Admitting: Hematology and Oncology

## 2020-06-05 ENCOUNTER — Telehealth: Payer: Self-pay | Admitting: *Deleted

## 2020-06-05 DIAGNOSIS — C50911 Malignant neoplasm of unspecified site of right female breast: Secondary | ICD-10-CM

## 2020-06-05 NOTE — Telephone Encounter (Signed)
Son called asking if patient needs to have a mammogram prior to her annual appointment on 7/21. Please advise

## 2020-06-05 NOTE — Telephone Encounter (Signed)
  Screening mammogram order in for 07/02/2020.  Please schedule mammogram and change follow-up clinic appt after mammogram.  Thanks  (I think that this wasn't scheduled as it was a year out last year).

## 2020-06-25 ENCOUNTER — Ambulatory Visit: Payer: Medicare HMO | Admitting: Hematology and Oncology

## 2020-06-25 ENCOUNTER — Other Ambulatory Visit: Payer: Medicare HMO

## 2020-07-03 ENCOUNTER — Ambulatory Visit
Admission: RE | Admit: 2020-07-03 | Discharge: 2020-07-03 | Disposition: A | Discharge status: Home / Self Care | Payer: Medicare HMO | Source: Ambulatory Visit | Attending: Hematology and Oncology | Admitting: Hematology and Oncology

## 2020-07-03 DIAGNOSIS — Z170 Estrogen receptor positive status [ER+]: Secondary | ICD-10-CM | POA: Diagnosis not present

## 2020-07-03 DIAGNOSIS — Z1231 Encounter for screening mammogram for malignant neoplasm of breast: Secondary | ICD-10-CM | POA: Insufficient documentation

## 2020-07-03 DIAGNOSIS — C50911 Malignant neoplasm of unspecified site of right female breast: Secondary | ICD-10-CM

## 2020-07-06 NOTE — Progress Notes (Signed)
[facility]  [address], [address] [place], [place] [address] Phone: [phone]  Fax: [phone]   Clinic Day:  07/07/2020  Referring physician: Idelle Crouch, MD  Chief Complaint: Brittney Chaney is a 86 y.o. female  with stage I right breast cancer who is seen for 1 year assessment.  HPI: The patient was last seen in the medical oncology clinic on 07/05/2019 via telemedicine. At that time, she was doing well and voiced no concerns. Hematocrit was 43.1, hemoglobin 14.5, platelets 132,000, WBC 6,700. Sodium was 133. CO2 was 21. Creatinine was 0.94 (CrCl 54 ml/min). CA27.29 was 14.6.  Screening mammogram on 07/03/2020 revealed mammographic evidence of malignancy.  She received the [place] COVID-19 vaccine on 03/20/2020 and 04/15/2020.  During the interim, she has been "alright".  She notes that her blood pressure has been running a little high at home. She does not measure it regularly, but states that her systolic BP is usually in the 140s. She states that it is always high when she goes to the doctor. She performs monthly breast self-exams and has no new concerns. She reports leg swelling.   Past Medical History:  Diagnosis Date  . Breast cancer [facility]) 2013   right breast with radiation  . Personal history of radiation therapy    f/u breast cancer   . Uterine cancer ([place]) 2010    Past Surgical History:  Procedure Laterality Date  . BREAST BIOPSY Right 03/19/2015   US guided biopsy - negative fat necrosis  . BREAST BIOPSY Right 2013   INVASIVE LOBULAR CARCINOMA, NOTTINGHAM COMBINED HISTOLOGIC GRADE 1.   . BREAST LUMPECTOMY Right 2013   breast cancer with rad  . EXCISION / BIOPSY BREAST / NIPPLE / DUCT Right ?   benign    Family History  Problem Relation Age of Onset  . Breast cancer Sister 73  . Breast cancer Sister 68    Social History:  reports that she has never smoked. She has never used smokeless tobacco. No history on file for alcohol  use and drug use. She states that she lost her husband on 03/21/2016 and her daughter passed away in 01-04-2017 at 63 yo.She lives in [place] patient does not drive. Her son brought her to the clinic today. The patient is alone today.  Allergies: No Known Allergies  Current Medications: Current Outpatient Medications  Medication Sig Dispense Refill  . acetaminophen (TYLENOL) 500 MG tablet Take 500 mg by mouth every 6 (six) hours as needed.     . ALPRAZolam (XANAX) 0.5 MG tablet Take 0.5 mg by mouth daily as needed.     Marland Kitchen aspirin EC 81 MG tablet Take 81 mg by mouth daily.     Marland Kitchen atenolol (TENORMIN) 50 MG tablet Take 50 mg by mouth daily.     . calcium carbonate (CALCIUM 600) 600 MG TABS tablet Take 600 mg by mouth.    . Calcium Carbonate-Vitamin D3 600-400 MG-UNIT TABS Take 2 capsules by mouth daily.     . enalapril (VASOTEC) 20 MG tablet Take 20 mg by mouth daily.     . Multiple Vitamins-Minerals (CENTRUM SILVER PO) Take by mouth.    Marland Kitchen omeprazole (PRILOSEC) 20 MG capsule Take 20 mg by mouth daily.     . simvastatin (ZOCOR) 40 MG tablet Take 40 mg by mouth daily at 6 PM.     . anastrozole (ARIMIDEX) 1 MG tablet Take 1 tablet (1 mg total) by mouth daily. (Patient not taking: Reported  on 07/05/2019) 90 tablet 3   No current facility-administered medications for this visit.     Review of Systems  Constitutional: Negative.  Negative for chills, diaphoresis, fever, malaise/fatigue and weight loss (up 5 lbs).       Doing well.  HENT: Negative.  Negative for congestion, ear discharge, ear pain, hearing loss, nosebleeds, sinus pain, sore throat and tinnitus.   Eyes: Negative.  Negative for blurred vision and double vision.  Respiratory: Negative.  Negative for cough, hemoptysis, sputum production and shortness of breath.   Cardiovascular: Positive for leg swelling. Negative for chest pain, palpitations, claudication and PND.  Gastrointestinal: Negative.  Negative for abdominal pain, blood in  stool, constipation, diarrhea, heartburn, melena, nausea and vomiting.  Genitourinary: Negative.  Negative for dysuria, frequency, hematuria and urgency.  Musculoskeletal: Negative.  Negative for back pain, joint pain, myalgias and neck pain.  Skin: Negative.  Negative for itching and rash.  Neurological: Negative.  Negative for dizziness, tingling, sensory change, weakness and headaches.  Endo/Heme/Allergies: Negative.  Does not bruise/bleed easily.  Psychiatric/Behavioral: Negative.  Negative for depression, memory loss and substance abuse. The patient is not nervous/anxious and does not have insomnia.   All other systems reviewed and are negative.   Performance status (ECOG): 0  Vital Signs Blood pressure (!) 195/76, pulse 84, temperature 98 F (36.7 C), temperature source Tympanic, weight 156 lb 12 oz (71.1 kg), SpO2 95 %.  Physical Exam Vitals and nursing note reviewed.  Constitutional:      General: She is not in acute distress.    Appearance: She is not diaphoretic.  HENT:     Head: Normocephalic and atraumatic.     Comments: Scar on right cheek from Mohs surgery. Eyes:     General: No scleral icterus.    Extraocular Movements: Extraocular movements intact.     Conjunctiva/sclera: Conjunctivae normal.     Pupils: Pupils are equal, round, and reactive to light.  Cardiovascular:     Rate and Rhythm: Normal rate and regular rhythm.     Heart sounds: Normal heart sounds. No murmur heard.   Pulmonary:     Effort: Pulmonary effort is normal. No respiratory distress.     Breath sounds: Normal breath sounds. No wheezing or rales.  Chest:     Chest wall: No tenderness.     Breasts:        Right: Skin change (scarring at 12 o clock, 6 cm from the nipple) present.        Left: No mass, skin change or tenderness.  Abdominal:     General: Bowel sounds are normal. There is no distension.     Palpations: Abdomen is soft. There is no hepatomegaly, splenomegaly or mass.      Tenderness: There is no abdominal tenderness. There is no guarding or rebound.  Musculoskeletal:        General: No swelling or tenderness. Normal range of motion.     Cervical back: Normal range of motion and neck supple.  Lymphadenopathy:     Head:     Right side of head: No preauricular, posterior auricular or occipital adenopathy.     Left side of head: No preauricular, posterior auricular or occipital adenopathy.     Cervical: No cervical adenopathy.     Upper Body:     Right upper body: No supraclavicular or axillary adenopathy.     Left upper body: No supraclavicular or axillary adenopathy.     Lower Body: No right inguinal adenopathy.  No left inguinal adenopathy.  Skin:    General: Skin is warm and dry.     Comments: Multiple moles.  Neurological:     Mental Status: She is alert and oriented to person, place, and time. Mental status is at baseline.  Psychiatric:        Mood and Affect: Mood normal.        Behavior: Behavior normal.        Thought Content: Thought content normal.        Judgment: Judgment normal.    Appointment on 07/07/2020  Component Date Value Ref Range Status  . WBC 07/07/2020 5.8  4.0 - 10.5 K/uL Final  . RBC 07/07/2020 4.87  3.87 - 5.11 MIL/uL Final  . Hemoglobin 07/07/2020 15.0  12.0 - 15.0 g/dL Final  . HCT 07/07/2020 45.1  36 - 46 % Final  . MCV 07/07/2020 92.6  80.0 - 100.0 fL Final  . MCH 07/07/2020 30.8  26.0 - 34.0 pg Final  . MCHC 07/07/2020 33.3  30.0 - 36.0 g/dL Final  . RDW 07/07/2020 12.7  11.5 - 15.5 % Final  . Platelets 07/07/2020 144* 150 - 400 K/uL Final  . nRBC 07/07/2020 0.0  0.0 - 0.2 % Final  . Neutrophils Relative % 07/07/2020 59  % Final  . Neutro Abs 07/07/2020 3.4  1.7 - 7.7 K/uL Final  . Lymphocytes Relative 07/07/2020 31  % Final  . Lymphs Abs 07/07/2020 1.8  0.7 - 4.0 K/uL Final  . Monocytes Relative 07/07/2020 7  % Final  . Monocytes Absolute 07/07/2020 0.4  0 - 1 K/uL Final  . Eosinophils Relative 07/07/2020 2  %  Final  . Eosinophils Absolute 07/07/2020 0.1  0 - 0 K/uL Final  . Basophils Relative 07/07/2020 1  % Final  . Basophils Absolute 07/07/2020 0.0  0 - 0 K/uL Final  . Immature Granulocytes 07/07/2020 0  % Final  . Abs Immature Granulocytes 07/07/2020 0.01  0.00 - 0.07 K/uL Final   Performed at [facility], [address] [place], [place] [address]    Assessment:  Brittney Chaney is a 85 y.o. female with stage I right breast cancers/p wide excision and sentinel lymph node biopsy on 05/02/2012. Pathology revealed a 1.5 cm grade I invasive lobular carcinoma in the right upper outer quadrant. Tumor was ER positive (>90%), PR positive (>90%) and Her2/neu negative  She received accelerated partial breast radiation (Mammosite). She began Arimidexin 05/2012, completed 06/2018.   Bilateral mammogramon 03/16/2017 revealed no evidence of malignancy.  Bilateral breast mammogram and right breast axilla ultrasound on 07/03/2019 revealed no malignancy in either breast.  There were stable postsurgical and posttreatment changes in the right breast.  Screening bilateral mammogram on 07/03/2020 revealed mammographic evidence of malignancy.  CA27.29 has been followed: 17.4 on 11/14/2012, 19.0 on 05/15/2013, 14.6 on 11/13/2013, 21.2 on 07/05/2016, 21.8 on 01/04/2017, 14.6 on 07/05/2017, 15 on 01/04/2018, 14.6 on 06/27/2019 and 20.9 on 07/07/2020.  Bone densityon 09/19/2014 revealed osteopeniawith a T-score of -1.4 in L1-L4. Bone density on 03/16/2017 revealed osteopenia with a T-score of -1.8 in the right femoral neck and -0.9 in the AP spine L1-L4. She takes calcium and vitamin D. She declined Prolia.  She received the [place] COVID-19 vaccine on 03/20/2020 and 04/15/2020.  Symptomatically, she feels "alright".  She denies any breast concerns.  Exam is stable.  Plan: 1.   Labs today:  CBC with diff, CMP, CA27.29. 2.   Stage I right breast cancer  Clinically, she is doing  well.    Exam is unremarkable.    She completed Arimidex in 06/2018.  CA 27.29 is normal (available after clinic).             Screening bilateral mammogram on 07/03/2020 revealed no evidence of malignancy (available after clinic). 3.   Osteopenia             Continue calcium and vitamin D.  Continue checking bone density every 2 years (overdue). 4.   RN to call patient with CA27.29. 5.   Contact radiology re: mammogram results from 07/03/2020- pending. 6.   Bilateral screening mammogram on 07/03/2021. 7.   RTC in 1 year for MD assessment, labs (CBC with diff, CMP, CA27.29), and review of mammogram.  I discussed the assessment and treatment plan with the patient.  The patient was provided an opportunity to ask questions and all were answered.  The patient agreed with the plan and demonstrated an understanding of the instructions.  The patient was advised to call back or seek an in person evaluation if the symptoms worsen or if the condition fails to improve as anticipated.   Nolon Stalls, MD, PhD  07/07/2020, 2:22 PM  I, Mirian Mo Tufford, am acting as Education administrator for [employer]. Mike Gip, MD, PhD.  I, Saryna Kneeland C. Mike Gip, MD, have reviewed the above documentation for accuracy and completeness, and I agree with the above.

## 2020-07-07 ENCOUNTER — Ambulatory Visit: Payer: Medicare HMO | Admitting: Hematology and Oncology

## 2020-07-07 ENCOUNTER — Inpatient Hospital Stay: Payer: Medicare HMO | Attending: Hematology and Oncology

## 2020-07-07 ENCOUNTER — Inpatient Hospital Stay (HOSPITAL_BASED_OUTPATIENT_CLINIC_OR_DEPARTMENT_OTHER): Payer: Medicare HMO | Admitting: Hematology and Oncology

## 2020-07-07 ENCOUNTER — Other Ambulatory Visit: Payer: Medicare HMO

## 2020-07-07 ENCOUNTER — Encounter: Payer: Self-pay | Admitting: Hematology and Oncology

## 2020-07-07 ENCOUNTER — Other Ambulatory Visit: Payer: Self-pay

## 2020-07-07 VITALS — BP 195/76 | HR 84 | Temp 98.0°F | Wt 156.7 lb

## 2020-07-07 DIAGNOSIS — M8580 Other specified disorders of bone density and structure, unspecified site: Secondary | ICD-10-CM

## 2020-07-07 DIAGNOSIS — M85851 Other specified disorders of bone density and structure, right thigh: Secondary | ICD-10-CM | POA: Diagnosis not present

## 2020-07-07 DIAGNOSIS — Z170 Estrogen receptor positive status [ER+]: Secondary | ICD-10-CM

## 2020-07-07 DIAGNOSIS — C50911 Malignant neoplasm of unspecified site of right female breast: Secondary | ICD-10-CM

## 2020-07-07 DIAGNOSIS — Z923 Personal history of irradiation: Secondary | ICD-10-CM | POA: Insufficient documentation

## 2020-07-07 DIAGNOSIS — Z853 Personal history of malignant neoplasm of breast: Secondary | ICD-10-CM | POA: Insufficient documentation

## 2020-07-07 DIAGNOSIS — Z7982 Long term (current) use of aspirin: Secondary | ICD-10-CM | POA: Insufficient documentation

## 2020-07-07 DIAGNOSIS — Z79899 Other long term (current) drug therapy: Secondary | ICD-10-CM | POA: Insufficient documentation

## 2020-07-07 LAB — COMPREHENSIVE METABOLIC PANEL
ALT: 16 U/L (ref 0–44)
AST: 24 U/L (ref 15–41)
Albumin: 4 g/dL (ref 3.5–5.0)
Alkaline Phosphatase: 42 U/L (ref 38–126)
Anion gap: 7 (ref 5–15)
BUN: 15 mg/dL (ref 8–23)
CO2: 23 mmol/L (ref 22–32)
Calcium: 9 mg/dL (ref 8.9–10.3)
Chloride: 102 mmol/L (ref 98–111)
Creatinine, Ser: 0.98 mg/dL (ref 0.44–1.00)
GFR calc Af Amer: 59 mL/min — ABNORMAL LOW (ref >60)
GFR calc non Af Amer: 51 mL/min — ABNORMAL LOW (ref >60)
Glucose, Bld: 107 mg/dL — ABNORMAL HIGH (ref 70–99)
Potassium: 4.3 mmol/L (ref 3.5–5.1)
Sodium: 132 mmol/L — ABNORMAL LOW (ref 135–145)
Total Bilirubin: 0.7 mg/dL (ref 0.3–1.2)
Total Protein: 6.5 g/dL (ref 6.5–8.1)

## 2020-07-07 LAB — CBC WITH DIFFERENTIAL/PLATELET
Abs Immature Granulocytes: 0.01 10*3/uL (ref 0.00–0.07)
Basophils Absolute: 0 10*3/uL (ref 0.0–0.1)
Basophils Relative: 1 %
Eosinophils Absolute: 0.1 10*3/uL (ref 0.0–0.5)
Eosinophils Relative: 2 %
HCT: 45.1 % (ref 36.0–46.0)
Hemoglobin: 15 g/dL (ref 12.0–15.0)
Immature Granulocytes: 0 %
Lymphocytes Relative: 31 %
Lymphs Abs: 1.8 10*3/uL (ref 0.7–4.0)
MCH: 30.8 pg (ref 26.0–34.0)
MCHC: 33.3 g/dL (ref 30.0–36.0)
MCV: 92.6 fL (ref 80.0–100.0)
Monocytes Absolute: 0.4 10*3/uL (ref 0.1–1.0)
Monocytes Relative: 7 %
Neutro Abs: 3.4 10*3/uL (ref 1.7–7.7)
Neutrophils Relative %: 59 %
Platelets: 144 10*3/uL — ABNORMAL LOW (ref 150–400)
RBC: 4.87 MIL/uL (ref 3.87–5.11)
RDW: 12.7 % (ref 11.5–15.5)
WBC: 5.8 10*3/uL (ref 4.0–10.5)
nRBC: 0 % (ref 0.0–0.2)

## 2020-07-07 NOTE — Progress Notes (Signed)
No new changes noted today 

## 2020-07-08 LAB — CANCER ANTIGEN 27.29: CA 27.29: 20.9 U/mL (ref 0.0–38.6)

## 2020-07-21 DIAGNOSIS — N1832 Chronic kidney disease, stage 3b: Secondary | ICD-10-CM | POA: Insufficient documentation

## 2021-04-25 ENCOUNTER — Encounter: Payer: Self-pay | Admitting: Emergency Medicine

## 2021-04-25 ENCOUNTER — Other Ambulatory Visit: Payer: Self-pay

## 2021-04-25 ENCOUNTER — Ambulatory Visit
Admission: EM | Admit: 2021-04-25 | Discharge: 2021-04-25 | Disposition: A | Discharge status: Home / Self Care | Payer: Medicare HMO | Attending: Emergency Medicine | Admitting: Emergency Medicine

## 2021-04-25 ENCOUNTER — Ambulatory Visit (INDEPENDENT_AMBULATORY_CARE_PROVIDER_SITE_OTHER): Payer: Medicare HMO

## 2021-04-25 DIAGNOSIS — R0602 Shortness of breath: Secondary | ICD-10-CM

## 2021-04-25 DIAGNOSIS — R059 Cough, unspecified: Secondary | ICD-10-CM | POA: Diagnosis not present

## 2021-04-25 DIAGNOSIS — J441 Chronic obstructive pulmonary disease with (acute) exacerbation: Secondary | ICD-10-CM

## 2021-04-25 MED ORDER — AEROCHAMBER MV MISC
2 refills | Status: AC
Start: 2021-04-25 — End: ?

## 2021-04-25 MED ORDER — ALBUTEROL SULFATE HFA 108 (90 BASE) MCG/ACT IN AERS: 2.0000 | INHALATION_SPRAY | RESPIRATORY_TRACT | 0 refills | Status: AC | PRN

## 2021-04-25 MED ORDER — BENZONATATE 100 MG PO CAPS: 200.0000 mg | ORAL_CAPSULE | Freq: Three times a day (TID) | ORAL | 0 refills | Status: AC

## 2021-04-25 MED ORDER — PREDNISONE 20 MG PO TABS
60.0000 mg | ORAL_TABLET | Freq: Every day | ORAL | 0 refills | Status: AC
Start: 2021-04-25 — End: 2021-04-30

## 2021-04-25 NOTE — ED Provider Notes (Signed)
[facility]    CSN: [id number] Arrival date & time: 04/25/21  1155      History   Chief Complaint Chief Complaint  Patient presents with  . Cough    HPI Brittney Chaney is a 87 y.o. female.   HPI   87 year old female here for evaluation of runny nose, chest congestion, and fever.  Patient reports that she has had her symptoms for the past week.  She has not measured her fever.  Patient is reporting associated symptoms of shortness of breath and wheezing, nasal congestion with clear nasal discharge, and sneezing.  She reports that sneezing has resolved.  Patient denies ear pain or pressure, nausea, or vomiting.  Patient has a moist sounding cough and she coughs after every deep respiration or after speaking.  Past Medical History:  Diagnosis Date  . Breast cancer [facility]) 2013   right breast with radiation  . Personal history of radiation therapy    f/u breast cancer   . Uterine cancer [facility]) 2010    Patient Active Problem List   Diagnosis Date Noted  . H/O malignant neoplasm of breast 06/24/2015  . Cancer of right breast, stage 1, estrogen receptor positive ([place]) 06/24/2015  . Osteopenia 09/19/2014  . Benign essential HTN 02/13/2014  . Hypercholesterolemia without hypertriglyceridemia 02/13/2014  . Diabetes mellitus, type 2 ([place]) 02/13/2014  . Hernia of anterior abdominal wall 02/13/2014    Past Surgical History:  Procedure Laterality Date  . BREAST BIOPSY Right 03/19/2015   US guided biopsy - negative fat necrosis  . BREAST BIOPSY Right 2013   INVASIVE LOBULAR CARCINOMA, NOTTINGHAM COMBINED HISTOLOGIC GRADE 1.   . BREAST LUMPECTOMY Right 2013   breast cancer with rad  . EXCISION / BIOPSY BREAST / NIPPLE / DUCT Right ?   benign    OB History   No obstetric history on file.      Home Medications    Prior to Admission medications   Medication Sig Start Date End Date Taking? Authorizing Provider  albuterol (VENTOLIN HFA) 108 (90 Base) MCG/ACT  inhaler Inhale 2 puffs into the lungs every 4 (four) hours as needed. 04/25/21  Yes Margarette Canada, NP  aspirin EC 81 MG tablet Take 81 mg by mouth daily.    Yes [provider]  atenolol (TENORMIN) 50 MG tablet Take 50 mg by mouth daily.    Yes [provider]  benzonatate (TESSALON) 100 MG capsule Take 2 capsules (200 mg total) by mouth every 8 (eight) hours. 04/25/21  Yes Margarette Canada, NP  calcium carbonate (OS-CAL) 600 MG TABS tablet Take 600 mg by mouth.   Yes [provider]  Calcium Carbonate-Vitamin D3 600-400 MG-UNIT TABS Take 2 capsules by mouth daily.    Yes [provider]  enalapril (VASOTEC) 20 MG tablet Take 20 mg by mouth daily.    Yes [provider]  Multiple Vitamins-Minerals (CENTRUM SILVER PO) Take by mouth.   Yes [provider]  omeprazole (PRILOSEC) 20 MG capsule Take 20 mg by mouth daily.    Yes [provider]  predniSONE (DELTASONE) 20 MG tablet Take 3 tablets (60 mg total) by mouth daily with breakfast for 5 days. 3 tablets daily for 5 days. 04/25/21 04/30/21 Yes Margarette Canada, NP  simvastatin (ZOCOR) 40 MG tablet Take 40 mg by mouth daily at 6 PM.    Yes [provider]  Spacer/Aero-Holding Chambers (AEROCHAMBER MV) inhaler Use as instructed 04/25/21  Yes Margarette Canada, NP  acetaminophen (  TYLENOL) 500 MG tablet Take 500 mg by mouth every 6 (six) hours as needed.     [provider]  ALPRAZolam Duanne Moron) 0.5 MG tablet Take 0.5 mg by mouth daily as needed.  12/30/15   [provider]  anastrozole (ARIMIDEX) 1 MG tablet Take 1 tablet (1 mg total) by mouth daily. Patient not taking: Reported on 07/05/2019 01/04/17   Lequita Asal, MD    Family History Family History  Problem Relation Age of Onset  . Breast cancer Sister 54  . Breast cancer Sister 78    Social History Social History   Tobacco Use  . Smoking status: Never Smoker  . Smokeless tobacco: Never Used  Vaping Use  .  Vaping Use: Never used  Substance Use Topics  . Alcohol use: Not Currently    Alcohol/week: 0.0 standard drinks  . Drug use: Never     Allergies   Patient has no known allergies.   Review of Systems Review of Systems  Constitutional: Positive for fever. Negative for activity change and appetite change.  HENT: Positive for congestion, rhinorrhea and sneezing. Negative for ear pain.   Respiratory: Positive for cough, shortness of breath and wheezing.   Musculoskeletal: Negative for arthralgias and myalgias.  Skin: Negative for rash.  Hematological: Negative.   Psychiatric/Behavioral: Negative.      Physical Exam Triage Vital Signs ED Triage Vitals  Enc Vitals Group     BP 04/25/21 1212 (!) 146/65     Pulse Rate 04/25/21 1212 81     Resp 04/25/21 1212 14     Temp 04/25/21 1212 97.6 F (36.4 C)     Temp Source 04/25/21 1212 Oral     SpO2 04/25/21 1212 95 %     Weight 04/25/21 1208 150 lb (68 kg)     Height 04/25/21 1208 5\' 5"  (1.651 m)     Head Circumference --      Peak Flow --      Pain Score 04/25/21 1208 0     Pain Loc --      Pain Edu? --      Excl. in [place]? --    No data found.  Updated Vital Signs BP (!) 146/65 (BP Location: Left Arm)   Pulse 81   Temp 97.6 F (36.4 C) (Oral)   Resp 14   Ht 5\' 5"  (1.651 m)   Wt 150 lb (68 kg)   SpO2 95%   BMI 24.96 kg/m   Visual Acuity Right Eye Distance:   Left Eye Distance:   Bilateral Distance:    Right Eye Near:   Left Eye Near:    Bilateral Near:     Physical Exam Vitals and nursing note reviewed.  Constitutional:      General: She is not in acute distress.    Appearance: Normal appearance.  HENT:     Head: Normocephalic and atraumatic.     Right Ear: Tympanic membrane, ear canal and external ear normal.     Left Ear: Tympanic membrane, ear canal and external ear normal.     Nose: Congestion and rhinorrhea present.     Mouth/Throat:     Mouth: Mucous membranes are moist.     Pharynx: Oropharynx is  clear. Posterior oropharyngeal erythema present.  Cardiovascular:     Rate and Rhythm: Normal rate and regular rhythm.     Pulses: Normal pulses.     Heart sounds: Normal heart sounds. No murmur heard. No gallop.   Pulmonary:  Effort: Pulmonary effort is normal.     Breath sounds: Wheezing present. No rales.  Musculoskeletal:     Cervical back: Normal range of motion and neck supple.  Lymphadenopathy:     Cervical: No cervical adenopathy.  Skin:    General: Skin is warm and dry.     Capillary Refill: Capillary refill takes less than 2 seconds.     Findings: No erythema or rash.  Neurological:     General: No focal deficit present.     Mental Status: She is alert and oriented to person, place, and time.  Psychiatric:        Mood and Affect: Mood normal.        Behavior: Behavior normal.        Thought Content: Thought content normal.        Judgment: Judgment normal.      UC Treatments / Results  Labs (all labs ordered are listed, but only abnormal results are displayed) Labs Reviewed - No data to display  EKG   Radiology DG Chest 2 View  Result Date: 04/25/2021 CLINICAL DATA:  Cough and fever. Shortness of breath. EXAM: CHEST - 2 VIEW COMPARISON:  None. FINDINGS: Heart is mildly enlarged. Atherosclerotic changes are noted at the aortic arch. Changes COPD are present. No edema or effusion present. No focal airspace disease is present. Mild exaggerated thoracic kyphosis noted. Leftward curvature is present in the upper thoracic spine. No acute osseous abnormality is present. IMPRESSION: 1. No acute cardiopulmonary disease or significant interval change. 2. COPD. 3. Atherosclerosis. Electronically Signed   By: San Morelle M.D.   On: 04/25/2021 13:13    Procedures Procedures (including critical care time)  Medications Ordered in UC Medications - No data to display  Initial Impression / Assessment and Plan / UC Course  I have reviewed the triage vital signs  and the nursing notes.  Pertinent labs & imaging results that were available during my care of the patient were reviewed by me and considered in my medical decision making (see chart for details).   Patient is a very pleasant 87 year old female here for evaluation of runny nose, chest congestion, and fever that she has had for the past week.  She is also had associated shortness of breath and wheezing.  Patient reports that her symptoms started off as sneezing and nasal congestion with clear nasal discharge.  She did denies any ear pain but does complain they have been itching.  She is also had no nausea or vomiting.  Patient is a very moist sounding respiration and cough.  After every bout of speaking or taking deep respiration she has a cough but has not brought up any sputum.  Physical exam reveals moderate degree of cerumen in the right external auditory canal but the tympanic membrane is pearly gray in the upper portion that I am able to visualize.  Left tympanic membrane is pearly gray with a normal light reflex and clear external auditory canal.  Nasal mucosa is erythematous and edematous with scant clear nasal discharge.  Patient has mild erythema and cobblestoning in the posterior oropharynx with clear postnasal drip.  No cervical lymphadenopathy appreciated exam.  Lung sounds have decreased lung sounds diffusely with expiratory wheezes in the middle lobes bilaterally.  Will obtain chest x-ray to rule out intrathoracic process.  Chest x-ray is negative for acute intrathoracic process but does show COPD changes.  Patient does not carry diagnosis of COPD.  Will treat patient for COPD exacerbation with  albuterol inhaler and spacer, Tessalon Perles, and prednisone.   Final Clinical Impressions(s) / UC Diagnoses   Final diagnoses:  COPD exacerbation [facility])     Discharge Instructions     Your chest x-ray shows signs of chronic obstructive pulmonary disease.  I Minna treat you for a COPD  exacerbation with some prednisone and some medicine to help you with cough and with your breathing.  Use the albuterol inhaler with the spacer, 2 puffs every 4-6 hours, as needed for shortness of breath and wheezing.  Starting tomorrow morning start taking the prednisone, 3 tablets every morning with breakfast for 5 days total.  Use the Tessalon Perles every 8 hours as needed for cough.  Follow-up with your primary care provider for referral to pulmonology.    ED Prescriptions    Medication Sig Dispense Auth. Provider   albuterol (VENTOLIN HFA) 108 (90 Base) MCG/ACT inhaler Inhale 2 puffs into the lungs every 4 (four) hours as needed. 18 g Margarette Canada, NP   Spacer/Aero-Holding Chambers (AEROCHAMBER MV) inhaler Use as instructed 1 each Margarette Canada, NP   predniSONE (DELTASONE) 20 MG tablet Take 3 tablets (60 mg total) by mouth daily with breakfast for 5 days. 3 tablets daily for 5 days. 15 tablet Margarette Canada, NP   benzonatate (TESSALON) 100 MG capsule Take 2 capsules (200 mg total) by mouth every 8 (eight) hours. 21 capsule Margarette Canada, NP     PDMP not reviewed this encounter.   Margarette Canada, NP 04/25/21 1328

## 2021-04-25 NOTE — ED Triage Notes (Signed)
Patient c/o runny nose cough, chest congestion and low grade fever for a week.

## 2021-04-25 NOTE — Discharge Instructions (Signed)
Your chest x-ray shows signs of chronic obstructive pulmonary disease.  I Brittney Chaney treat you for a COPD exacerbation with some prednisone and some medicine to help you with cough and with your breathing.  Use the albuterol inhaler with the spacer, 2 puffs every 4-6 hours, as needed for shortness of breath and wheezing.  Starting tomorrow morning start taking the prednisone, 3 tablets every morning with breakfast for 5 days total.  Use the Tessalon Perles every 8 hours as needed for cough.  Follow-up with your primary care provider for referral to pulmonology.

## 2021-07-06 ENCOUNTER — Other Ambulatory Visit: Payer: Medicare HMO

## 2021-07-06 ENCOUNTER — Ambulatory Visit: Payer: Medicare HMO | Admitting: Hematology and Oncology

## 2024-09-21 ENCOUNTER — Emergency Department
Admission: EM | Admit: 2024-09-21 | Discharge: 2024-09-21 | Disposition: A | Discharge status: Home / Self Care | Attending: Emergency Medicine | Admitting: Emergency Medicine

## 2024-09-21 ENCOUNTER — Emergency Department

## 2024-09-21 ENCOUNTER — Other Ambulatory Visit: Payer: Self-pay

## 2024-09-21 DIAGNOSIS — R1012 Left upper quadrant pain: Secondary | ICD-10-CM | POA: Insufficient documentation

## 2024-09-21 DIAGNOSIS — M899 Disorder of bone, unspecified: Secondary | ICD-10-CM | POA: Insufficient documentation

## 2024-09-21 DIAGNOSIS — R0789 Other chest pain: Secondary | ICD-10-CM | POA: Insufficient documentation

## 2024-09-21 DIAGNOSIS — R1010 Upper abdominal pain, unspecified: Secondary | ICD-10-CM

## 2024-09-21 DIAGNOSIS — Q799 Congenital malformation of musculoskeletal system, unspecified: Secondary | ICD-10-CM

## 2024-09-21 DIAGNOSIS — R1011 Right upper quadrant pain: Secondary | ICD-10-CM | POA: Insufficient documentation

## 2024-09-21 DIAGNOSIS — R0781 Pleurodynia: Secondary | ICD-10-CM

## 2024-09-21 LAB — URINALYSIS, ROUTINE W REFLEX MICROSCOPIC
Bilirubin Urine: NEGATIVE
Glucose, UA: NEGATIVE mg/dL
Hgb urine dipstick: NEGATIVE
Ketones, ur: NEGATIVE mg/dL
Leukocytes,Ua: NEGATIVE
Nitrite: NEGATIVE
Protein, ur: NEGATIVE mg/dL
Specific Gravity, Urine: 1.021 (ref 1.005–1.030)
pH: 7 (ref 5.0–8.0)

## 2024-09-21 LAB — COMPREHENSIVE METABOLIC PANEL WITH GFR
ALT: 15 U/L (ref 0–44)
AST: 26 U/L (ref 15–41)
Albumin: 4.1 g/dL (ref 3.5–5.0)
Alkaline Phosphatase: 57 U/L (ref 38–126)
Anion gap: 14 (ref 5–15)
BUN: 19 mg/dL (ref 8–23)
CO2: 25 mmol/L (ref 22–32)
Calcium: 9.7 mg/dL (ref 8.9–10.3)
Chloride: 97 mmol/L — ABNORMAL LOW (ref 98–111)
Creatinine, Ser: 0.75 mg/dL (ref 0.44–1.00)
GFR, Estimated: >60 mL/min (ref >60)
Glucose, Bld: 173 mg/dL — ABNORMAL HIGH (ref 70–99)
Potassium: 4 mmol/L (ref 3.5–5.1)
Sodium: 136 mmol/L (ref 135–145)
Total Bilirubin: 0.9 mg/dL (ref 0.0–1.2)
Total Protein: 7.4 g/dL (ref 6.5–8.1)

## 2024-09-21 LAB — CBC
HCT: 43.6 % (ref 36.0–46.0)
Hemoglobin: 14.3 g/dL (ref 12.0–15.0)
MCH: 30.2 pg (ref 26.0–34.0)
MCHC: 32.8 g/dL (ref 30.0–36.0)
MCV: 92 fL (ref 80.0–100.0)
Platelets: 151 K/uL (ref 150–400)
RBC: 4.74 MIL/uL (ref 3.87–5.11)
RDW: 12.8 % (ref 11.5–15.5)
WBC: 7.5 K/uL (ref 4.0–10.5)
nRBC: 0 % (ref 0.0–0.2)

## 2024-09-21 LAB — LIPASE, BLOOD: Lipase: 27 U/L (ref 11–51)

## 2024-09-21 MED ORDER — ONDANSETRON HCL 4 MG/2ML IJ SOLN
4.0000 mg | Freq: Once | INTRAMUSCULAR | Status: AC
  Administered 2024-09-21: 4 mg via INTRAVENOUS
  Filled 2024-09-21: qty 2

## 2024-09-21 MED ORDER — MORPHINE SULFATE (PF) 2 MG/ML IV SOLN
2.0000 mg | Freq: Once | INTRAVENOUS | Status: AC
  Administered 2024-09-21: 2 mg via INTRAVENOUS
  Filled 2024-09-21: qty 1

## 2024-09-21 MED ORDER — HYDROCODONE-ACETAMINOPHEN 5-325 MG PO TABS: 0.5000 | ORAL_TABLET | Freq: Four times a day (QID) | ORAL | 0 refills | Status: AC | PRN

## 2024-09-21 MED ORDER — IOHEXOL 350 MG/ML SOLN
100.0000 mL | Freq: Once | INTRAVENOUS | Status: AC | PRN
  Administered 2024-09-21: 100 mL via INTRAVENOUS

## 2024-09-21 NOTE — ED Notes (Signed)
 ED Provider at bedside.

## 2024-09-21 NOTE — ED Provider Notes (Addendum)
 [facility] Provider Note    Event Date/Time   First MD Initiated Contact with Patient 09/21/24 1229     (approximate)   History   Abdominal Pain   HPI  Brittney Chaney is a 89 year old female with history of osteoporosis presenting to the emergency department for evaluation of lower chest/upper abdominal pain bilaterally.  Symptoms started on Saturday.  No identifiable history of trauma.  Worse with deep breaths.  Reviewed her primary care visit from 09/18/2024.  Reported rib pain at that time.  X-Vondell Babers and labs ordered.  Labs available for review, no critical derangements.  Radiology read of x-Rainey Kahrs without displaced rib fractures and clear lungs.       Physical Exam   Triage Vital Signs: ED Triage Vitals  Encounter Vitals Group     BP 09/21/24 1159 (!) 164/77     Girls Systolic BP Percentile --      Girls Diastolic BP Percentile --      Boys Systolic BP Percentile --      Boys Diastolic BP Percentile --      Pulse Rate 09/21/24 1159 75     Resp 09/21/24 1159 18     Temp 09/21/24 1159 98 F (36.7 C)     Temp src --      SpO2 09/21/24 1159 96 %     Weight 09/21/24 1157 149 lb (67.6 kg)     Height 09/21/24 1157 5' 5 (1.651 m)     Head Circumference --      Peak Flow --      Pain Score 09/21/24 1157 5     Pain Loc --      Pain Education --      Exclude from Growth Chart --     Most recent vital signs: Vitals:   09/21/24 1237 09/21/24 1400  BP: (!) 164/79 (!) 144/76  Pulse: 79 73  Resp: 18 19  Temp:    SpO2: 99% 100%     General: Awake, interactive  CV:  Good peripheral perfusion Resp:  Unlabored respirations, lungs clear to auscultation Chest wall: Mild reproducible tenderness over the chest wall Abd:  Nondistended, soft, mild tenderness in the right upper quadrant not readily reproducible, history of cholecystectomy Neuro:  Symmetric facial movement, fluid speech   ED Results / Procedures / Treatments   Labs (all labs ordered  are listed, but only abnormal results are displayed) Labs Reviewed  COMPREHENSIVE METABOLIC PANEL WITH GFR - Abnormal; Notable for the following components:      Result Value   Chloride 97 (*)    Glucose, Bld 173 (*)    All other components within normal limits  URINALYSIS, ROUTINE W REFLEX MICROSCOPIC - Abnormal; Notable for the following components:   Color, Urine STRAW (*)    APPearance CLEAR (*)    All other components within normal limits  LIPASE, BLOOD  CBC     EKG EKG independently reviewed and interpreted by myself demonstrates:  EKG demonstrates sinus rhythm at a rate of 81, PR 182, QRS 130, QTc 502, no acute ST changes, right bundle and left anterior fascicular block noted  RADIOLOGY Imaging independently reviewed and interpreted by myself demonstrates:  CTA chest pending CT abdomen pelvis pending  Formal Radiology Read:  CT Angio Chest PE W and/or Wo Contrast Result Date: 09/21/2024 CLINICAL DATA:  Pulmonary embolism (PE) suspected, high prob; Abdominal pain, acute, nonlocalized. History of breast and uterine cancer. * Tracking Code: BO * EXAM:  CT ANGIOGRAPHY CHEST CT ABDOMEN AND PELVIS WITH CONTRAST TECHNIQUE: Multidetector CT imaging of the chest was performed using the standard protocol during bolus administration of intravenous contrast. Multiplanar CT image reconstructions and MIPs were obtained to evaluate the vascular anatomy. Multidetector CT imaging of the abdomen and pelvis was performed using the standard protocol during bolus administration of intravenous contrast. RADIATION DOSE REDUCTION: This exam was performed according to the departmental dose-optimization program which includes automated exposure control, adjustment of the mA and/or kV according to patient size and/or use of iterative reconstruction technique. CONTRAST:  100mL OMNIPAQUE IOHEXOL 350 MG/ML SOLN COMPARISON:  None Available. FINDINGS: CTA CHEST FINDINGS Cardiovascular: No evidence of embolism to  the proximal subsegmental pulmonary artery level. Mild cardiomegaly. No pericardial effusion. No aortic aneurysm. There are coronary artery calcifications, in keeping with coronary artery disease. There are also mild peripheral atherosclerotic vascular calcifications of thoracic aorta and its major branches. Mediastinum/Nodes: Visualized thyroid gland appears grossly unremarkable. No solid / cystic mediastinal masses. The esophagus is nondistended precluding optimal assessment. No axillary, mediastinal or hilar lymphadenopathy by size criteria. Lungs/Pleura: The central tracheo-bronchial tree is patent. There are patchy areas of linear, plate-like atelectasis and/or scarring throughout bilateral lungs. No mass or consolidation. No pleural effusion or pneumothorax. There are several sub 4 mm calcified and noncalcified nodules in the left lung (marked with electronic arrow sign on series 2006). Musculoskeletal: The visualized soft tissues of the chest wall are grossly unremarkable. No suspicious osseous lesions. There are mild multilevel degenerative changes in the visualized spine. Review of the MIP images confirms the above findings. CT ABDOMEN and PELVIS FINDINGS Hepatobiliary: The liver is normal in size. Non-cirrhotic configuration. No suspicious mass. These is mild diffuse hepatic steatosis. No intrahepatic bile duct dilation. There is mild prominence of the extrahepatic bile duct, most likely due to post cholecystectomy status. Gallbladder is surgically absent. Pancreas: Unremarkable. No pancreatic ductal dilatation or surrounding inflammatory changes. Spleen: Within normal limits. No focal lesion. Adrenals/Urinary Tract: Adrenal glands are unremarkable. No suspicious renal mass. There are several scattered simple cysts throughout bilateral kidneys with largest arising from the left kidney lower pole, anteromedially measuring up to 3.0 x 3.1 cm. No nephroureterolithiasis or obstructive uropathy. Unremarkable  urinary bladder. Stomach/Bowel: Moderate-to-large hiatal hernia noted containing majority of the stomach. No disproportionate dilation of the small or large bowel loops. No evidence of abnormal bowel wall thickening or inflammatory changes. The appendix was not visualized; however there is no acute inflammatory process in the right lower quadrant. There are multiple diverticula throughout the colon, without imaging signs of diverticulitis. Post hemorrhoidectomy changes noted in the lower rectum. Vascular/Lymphatic: No ascites or pneumoperitoneum. No abdominal or pelvic lymphadenopathy, by size criteria. No aneurysmal dilation of the major abdominal arteries. There are mild peripheral atherosclerotic vascular calcifications of the aorta and its major branches. Reproductive: The uterus is surgically absent. No large adnexal mass. Other: There are bilateral small fat containing inguinal hernias. The soft tissues and abdominal wall are otherwise unremarkable. Musculoskeletal: There is a lytic lesion with sclerotic rim in the anterior aspect of the T11 vertebral body measuring up to 1.3 x 2.1 cm. There are 2 additional smaller lesion in the posterior aspect of the T11 vertebral body extending into the pedicles. There are also additional sclerotic lesions in the pelvic bones (for example, series 5, images 45, 66, 76, etc.). These are incompletely characterized on the current exam but concerning for osseous metastases. Clinical correlation and further evaluation with nuclear medicine bone scan or  PET scan is recommended. There are mild - moderate multilevel degenerative changes in the visualized spine. Review of the MIP images confirms the above findings. IMPRESSION: 1. No embolism to the proximal subsegmental pulmonary artery level. 2. No acute inflammatory process identified within the abdomen or pelvis. 3. There are several sclerotic osseous lesions, as described above, concerning for osseous metastases. No pathological  fracture. 4. Multiple other nonacute observations, as described above. Electronically Signed   By: Ree Molt M.D.   On: 09/21/2024 15:25   CT ABDOMEN PELVIS W CONTRAST Result Date: 09/21/2024 CLINICAL DATA:  Pulmonary embolism (PE) suspected, high prob; Abdominal pain, acute, nonlocalized. History of breast and uterine cancer. * Tracking Code: BO * EXAM: CT ANGIOGRAPHY CHEST CT ABDOMEN AND PELVIS WITH CONTRAST TECHNIQUE: Multidetector CT imaging of the chest was performed using the standard protocol during bolus administration of intravenous contrast. Multiplanar CT image reconstructions and MIPs were obtained to evaluate the vascular anatomy. Multidetector CT imaging of the abdomen and pelvis was performed using the standard protocol during bolus administration of intravenous contrast. RADIATION DOSE REDUCTION: This exam was performed according to the departmental dose-optimization program which includes automated exposure control, adjustment of the mA and/or kV according to patient size and/or use of iterative reconstruction technique. CONTRAST:  100mL OMNIPAQUE IOHEXOL 350 MG/ML SOLN COMPARISON:  None Available. FINDINGS: CTA CHEST FINDINGS Cardiovascular: No evidence of embolism to the proximal subsegmental pulmonary artery level. Mild cardiomegaly. No pericardial effusion. No aortic aneurysm. There are coronary artery calcifications, in keeping with coronary artery disease. There are also mild peripheral atherosclerotic vascular calcifications of thoracic aorta and its major branches. Mediastinum/Nodes: Visualized thyroid gland appears grossly unremarkable. No solid / cystic mediastinal masses. The esophagus is nondistended precluding optimal assessment. No axillary, mediastinal or hilar lymphadenopathy by size criteria. Lungs/Pleura: The central tracheo-bronchial tree is patent. There are patchy areas of linear, plate-like atelectasis and/or scarring throughout bilateral lungs. No mass or  consolidation. No pleural effusion or pneumothorax. There are several sub 4 mm calcified and noncalcified nodules in the left lung (marked with electronic arrow sign on series 2006). Musculoskeletal: The visualized soft tissues of the chest wall are grossly unremarkable. No suspicious osseous lesions. There are mild multilevel degenerative changes in the visualized spine. Review of the MIP images confirms the above findings. CT ABDOMEN and PELVIS FINDINGS Hepatobiliary: The liver is normal in size. Non-cirrhotic configuration. No suspicious mass. These is mild diffuse hepatic steatosis. No intrahepatic bile duct dilation. There is mild prominence of the extrahepatic bile duct, most likely due to post cholecystectomy status. Gallbladder is surgically absent. Pancreas: Unremarkable. No pancreatic ductal dilatation or surrounding inflammatory changes. Spleen: Within normal limits. No focal lesion. Adrenals/Urinary Tract: Adrenal glands are unremarkable. No suspicious renal mass. There are several scattered simple cysts throughout bilateral kidneys with largest arising from the left kidney lower pole, anteromedially measuring up to 3.0 x 3.1 cm. No nephroureterolithiasis or obstructive uropathy. Unremarkable urinary bladder. Stomach/Bowel: Moderate-to-large hiatal hernia noted containing majority of the stomach. No disproportionate dilation of the small or large bowel loops. No evidence of abnormal bowel wall thickening or inflammatory changes. The appendix was not visualized; however there is no acute inflammatory process in the right lower quadrant. There are multiple diverticula throughout the colon, without imaging signs of diverticulitis. Post hemorrhoidectomy changes noted in the lower rectum. Vascular/Lymphatic: No ascites or pneumoperitoneum. No abdominal or pelvic lymphadenopathy, by size criteria. No aneurysmal dilation of the major abdominal arteries. There are mild peripheral atherosclerotic vascular  calcifications  of the aorta and its major branches. Reproductive: The uterus is surgically absent. No large adnexal mass. Other: There are bilateral small fat containing inguinal hernias. The soft tissues and abdominal wall are otherwise unremarkable. Musculoskeletal: There is a lytic lesion with sclerotic rim in the anterior aspect of the T11 vertebral body measuring up to 1.3 x 2.1 cm. There are 2 additional smaller lesion in the posterior aspect of the T11 vertebral body extending into the pedicles. There are also additional sclerotic lesions in the pelvic bones (for example, series 5, images 45, 66, 76, etc.). These are incompletely characterized on the current exam but concerning for osseous metastases. Clinical correlation and further evaluation with nuclear medicine bone scan or PET scan is recommended. There are mild - moderate multilevel degenerative changes in the visualized spine. Review of the MIP images confirms the above findings. IMPRESSION: 1. No embolism to the proximal subsegmental pulmonary artery level. 2. No acute inflammatory process identified within the abdomen or pelvis. 3. There are several sclerotic osseous lesions, as described above, concerning for osseous metastases. No pathological fracture. 4. Multiple other nonacute observations, as described above. Electronically Signed   By: Ree Molt M.D.   On: 09/21/2024 15:25    PROCEDURES:  Critical Care performed: No  Procedures   MEDICATIONS ORDERED IN ED: Medications  morphine (PF) 2 MG/ML injection 2 mg (2 mg Intravenous Given 09/21/24 1405)  ondansetron (ZOFRAN) injection 4 mg (4 mg Intravenous Given 09/21/24 1405)  iohexol (OMNIPAQUE) 350 MG/ML injection 100 mL (100 mLs Intravenous Contrast Given 09/21/24 1421)     IMPRESSION / MDM / ASSESSMENT AND PLAN / ED COURSE  I reviewed the triage vital signs and the nursing notes.  Differential diagnosis includes, but is not limited to, rib fracture, pneumothorax,  pancreatitis, biliary pathology, other acute intra-abdominal process  Patient's presentation is most consistent with acute presentation with potential threat to life or bodily function.  89 year old female presenting with lower chest and upper abdominal pain.  Stable vitals on presentation.  Reassuring CBC, CMP, lipase.  Negative x-Karsynn Deweese at her primary care office.  Lungs are to auscultation.  Consideration for both cardiopulmonary and intra-abdominal causes.  Will obtain CT of the chest as well as CT abdomen pelvis to further evaluate.  Clinical Course as of 09/21/24 1543  Fri Sep 21, 2024  1538 CTA without PE.  CT abdomen pelvis without acute intra-abdominal abnormality.  Multiple sclerotic osseous lesions were noted, per radiologist concerning for metastases.  No pathologic fracture noted.  Patient reassessed and updated on results of workup.  Reports that she is in remission from her known prior malignancies previously seen by oncology here locally.  I did discuss outpatient follow-up with her primary care doctor but will also make a referral to oncology for further evaluation of her bony abnormalities.  Patient does feel improved after receiving pain medication here and strongly prefers to be discharged home.  Do think this is reasonable.  Will DC with short course of Norco.  Strict return precautions provided.  Patient discharged in stable condition. [NR]    Clinical Course User Index [NR] Levander Slate, MD     FINAL CLINICAL IMPRESSION(S) / ED DIAGNOSES   Final diagnoses:  Pain of upper abdomen  Pleuritic chest pain  Bony abnormality     Rx / DC Orders   ED Discharge Orders          Ordered    Ambulatory referral to Hematology / Oncology  Comments: Your emergency department provider has referred you to see a hematology/oncology specialist. These are physicians who specialize in blood disorders and cancers, or findings concerning for cancer. You will receive a phone call from the  [facility] Office to set up your appointment within 2 business days: [employer] operate Mon - Fri, 8:00 a.m. to 5:00 p.m.; closed for federally recognized holidays. Please be sure your phone is not set to block numbers during this time.   09/21/24 1540    HYDROcodone-acetaminophen (NORCO/VICODIN) 5-325 MG tablet  Every 6 hours PRN        09/21/24 1542             Note:  This document was prepared using Dragon voice recognition software and may include unintentional dictation errors.   Levander Slate, MD 09/21/24 1525    Levander Slate, MD 09/21/24 [phone]

## 2024-09-21 NOTE — Discharge Instructions (Addendum)
 You are seen in the ER today for evaluation of your chest and abdominal pain.  Your CT scan did not show evidence of a blood clot or an emergency finding in your abdomen.  It did show you have some abnormal areas on your bone that can sometimes be related to a malignancy.  Follow-up with your primary care doctor for further evaluation.  I have also placed a referral to our oncology team for further evaluation.  Return to the ER for new or worsening symptoms.  I have sent a prescription for a narcotic pain medicine for severe breathrough pain. Do not drink alcohol, drive or participate in any other potentially dangerous activities while taking this medication as it may make you sleepy. Do not take this medication with any other sedating medications, either prescription or over-the-counter.  This medication is intended for your use only - do not give any to anyone else and keep it in a secure place where nobody else, especially children, have access to it.  It can also cause or worsen constipation, so you may want to consider taking an over-the-counter stool softener while you are taking this medication.

## 2024-09-21 NOTE — ED Notes (Signed)
 Patient stated 0 pain at rest however when she moves the pain is really bad but only with movement.

## 2024-09-21 NOTE — ED Notes (Signed)
 Pt family approached nursing station regarding the patients pain medications. Informed no orders were placed but MD Ray will be notified.

## 2024-09-21 NOTE — ED Triage Notes (Signed)
 Pt comes with abdominal pain since Saturday. Pt went to her pcp and had bloodwork and xray done. Pt placed on presidone. Pt states pain is worse. Pt states no N/v. Pt state pain is right under both breast.  Pt states sharp pain.

## 2024-09-28 ENCOUNTER — Inpatient Hospital Stay

## 2024-09-28 ENCOUNTER — Encounter: Payer: Self-pay | Admitting: Oncology

## 2024-09-28 ENCOUNTER — Inpatient Hospital Stay: Attending: Oncology | Admitting: Oncology

## 2024-09-28 VITALS — BP 125/58 | HR 77 | Temp 97.3°F | Resp 18 | Ht 65.0 in | Wt 147.2 lb

## 2024-09-28 DIAGNOSIS — Z1721 Progesterone receptor positive status: Secondary | ICD-10-CM | POA: Diagnosis not present

## 2024-09-28 DIAGNOSIS — Z1732 Human epidermal growth factor receptor 2 negative status: Secondary | ICD-10-CM | POA: Insufficient documentation

## 2024-09-28 DIAGNOSIS — M898X9 Other specified disorders of bone, unspecified site: Secondary | ICD-10-CM | POA: Diagnosis not present

## 2024-09-28 DIAGNOSIS — Z79899 Other long term (current) drug therapy: Secondary | ICD-10-CM | POA: Insufficient documentation

## 2024-09-28 DIAGNOSIS — K760 Fatty (change of) liver, not elsewhere classified: Secondary | ICD-10-CM | POA: Diagnosis not present

## 2024-09-28 DIAGNOSIS — Z853 Personal history of malignant neoplasm of breast: Secondary | ICD-10-CM | POA: Diagnosis not present

## 2024-09-28 DIAGNOSIS — Z803 Family history of malignant neoplasm of breast: Secondary | ICD-10-CM | POA: Insufficient documentation

## 2024-09-28 DIAGNOSIS — Z170 Estrogen receptor positive status [ER+]: Secondary | ICD-10-CM | POA: Diagnosis not present

## 2024-09-28 DIAGNOSIS — Z7982 Long term (current) use of aspirin: Secondary | ICD-10-CM | POA: Diagnosis not present

## 2024-09-28 DIAGNOSIS — E119 Type 2 diabetes mellitus without complications: Secondary | ICD-10-CM | POA: Diagnosis not present

## 2024-09-28 DIAGNOSIS — C50911 Malignant neoplasm of unspecified site of right female breast: Secondary | ICD-10-CM | POA: Insufficient documentation

## 2024-09-28 DIAGNOSIS — Z923 Personal history of irradiation: Secondary | ICD-10-CM | POA: Diagnosis not present

## 2024-09-28 DIAGNOSIS — Z79811 Long term (current) use of aromatase inhibitors: Secondary | ICD-10-CM | POA: Diagnosis not present

## 2024-09-28 DIAGNOSIS — I2510 Atherosclerotic heart disease of native coronary artery without angina pectoris: Secondary | ICD-10-CM | POA: Diagnosis not present

## 2024-09-28 DIAGNOSIS — M8580 Other specified disorders of bone density and structure, unspecified site: Secondary | ICD-10-CM | POA: Insufficient documentation

## 2024-09-28 DIAGNOSIS — C7951 Secondary malignant neoplasm of bone: Secondary | ICD-10-CM | POA: Insufficient documentation

## 2024-09-28 DIAGNOSIS — E7800 Pure hypercholesterolemia, unspecified: Secondary | ICD-10-CM | POA: Diagnosis not present

## 2024-09-28 DIAGNOSIS — G8929 Other chronic pain: Secondary | ICD-10-CM | POA: Insufficient documentation

## 2024-09-28 DIAGNOSIS — I10 Essential (primary) hypertension: Secondary | ICD-10-CM | POA: Diagnosis not present

## 2024-09-28 LAB — CBC WITH DIFFERENTIAL (CANCER CENTER ONLY)
Abs Immature Granulocytes: 0.04 K/uL (ref 0.00–0.07)
Basophils Absolute: 0.1 K/uL (ref 0.0–0.1)
Basophils Relative: 1 %
Eosinophils Absolute: 0.3 K/uL (ref 0.0–0.5)
Eosinophils Relative: 3 %
HCT: 37.2 % (ref 36.0–46.0)
Hemoglobin: 12.2 g/dL (ref 12.0–15.0)
Immature Granulocytes: 1 %
Lymphocytes Relative: 22 %
Lymphs Abs: 1.7 K/uL (ref 0.7–4.0)
MCH: 30.5 pg (ref 26.0–34.0)
MCHC: 32.8 g/dL (ref 30.0–36.0)
MCV: 93 fL (ref 80.0–100.0)
Monocytes Absolute: 0.8 K/uL (ref 0.1–1.0)
Monocytes Relative: 10 %
Neutro Abs: 4.9 K/uL (ref 1.7–7.7)
Neutrophils Relative %: 63 %
Platelet Count: 156 K/uL (ref 150–400)
RBC: 4 MIL/uL (ref 3.87–5.11)
RDW: 12.8 % (ref 11.5–15.5)
WBC Count: 7.8 K/uL (ref 4.0–10.5)
nRBC: 0 % (ref 0.0–0.2)

## 2024-09-28 LAB — CMP (CANCER CENTER ONLY)
ALT: 14 U/L (ref 0–44)
AST: 19 U/L (ref 15–41)
Albumin: 3.6 g/dL (ref 3.5–5.0)
Alkaline Phosphatase: 61 U/L (ref 38–126)
Anion gap: 9 (ref 5–15)
BUN: 23 mg/dL (ref 8–23)
CO2: 25 mmol/L (ref 22–32)
Calcium: 8.7 mg/dL — ABNORMAL LOW (ref 8.9–10.3)
Chloride: 95 mmol/L — ABNORMAL LOW (ref 98–111)
Creatinine: 1.02 mg/dL — ABNORMAL HIGH (ref 0.44–1.00)
GFR, Estimated: 51 mL/min — ABNORMAL LOW (ref >60)
Glucose, Bld: 105 mg/dL — ABNORMAL HIGH (ref 70–99)
Potassium: 4.3 mmol/L (ref 3.5–5.1)
Sodium: 129 mmol/L — ABNORMAL LOW (ref 135–145)
Total Bilirubin: 0.7 mg/dL (ref 0.0–1.2)
Total Protein: 6.2 g/dL — ABNORMAL LOW (ref 6.5–8.1)

## 2024-09-28 NOTE — Progress Notes (Signed)
 Hematology/Oncology Consult note [facility] Telephone:([phone] [phone]  Patient Care Team: Auston Reyes BIRCH, MD as PCP - General (Internal Medicine) Melanee Annah BROCKS, MD as Consulting Physician (Oncology)   Name of the patient: Brittney Chaney  [id number]  1929/09/12    Reason for referral-abnormal bony lesions   Referring physician-Dr. Nilsa Dade  Date of visit: 09/28/24   History of presenting illness-patient is a 89 year old female with a past medical history significant for breast cancer back in 2013 and uterine cancer in 2010.  Patient has previously seen Dr. Rudell for her right breast cancer s/p lumpectomy and sentinel lymph node biopsy in May 2013.  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  She completed adjuvant radiation therapy and took Arimidex  for 5 years up until July 2019.  Patient was in the ER on 09/21/2024 with symptoms of abdominal pain and underwent CT angio chest which did not show any evidence of PE.  Lytic lesion noted in T11 vertebral body measuring up to 1.3 x 2.1 cm.  Additional smaller lesion in the posterior aspect of the T11 vertebral body.  Additional sclerotic lesions in the pelvic bones concerning for osseous metastases.  No other inflammatory or malignant process noted in abdomen and pelvis.  Discussed the use of AI scribe software for clinical note transcription with the patient, who gave verbal consent to proceed.  Recently, she experienced pain under her rib cage, prompting a visit to the ER. A CT scan revealed a 1.3 cm spot in front of the T11 vertebral body and some tiny spots in her hip bones. No fractures were noted on the CT scan.  Prior to the ER visit, she had seen a PA at Dr. Auston' office, where x-rays and blood work were performed, both of which were unremarkable. She was prescribed prednisone , which did not alleviate her  pain.       ECOG PS- 3  Pain scale- 3   Review of systems- Review of Systems  Constitutional:  Negative for chills, fever, malaise/fatigue and weight loss.  HENT:  Negative for congestion, ear discharge and nosebleeds.   Eyes:  Negative for blurred vision.  Respiratory:  Negative for cough, hemoptysis, sputum production, shortness of breath and wheezing.        Lower chest wall/rib cage pain  Cardiovascular:  Negative for chest pain, palpitations, orthopnea and claudication.  Gastrointestinal:  Negative for abdominal pain, blood in stool, constipation, diarrhea, heartburn, melena, nausea and vomiting.  Genitourinary:  Negative for dysuria, flank pain, frequency, hematuria and urgency.  Musculoskeletal:  Negative for back pain, joint pain and myalgias.  Skin:  Negative for rash.  Neurological:  Negative for dizziness, tingling, focal weakness, seizures, weakness and headaches.  Endo/Heme/Allergies:  Does not bruise/bleed easily.  Psychiatric/Behavioral:  Negative for depression and suicidal ideas. The patient does not have insomnia.     No Known Allergies  Patient Active Problem List   Diagnosis Date Noted   H/O malignant neoplasm of breast 06/24/2015   Cancer of right breast, stage 1, estrogen receptor positive (HCC) 06/24/2015   Osteopenia 09/19/2014   Benign essential HTN 02/13/2014   Hypercholesterolemia without hypertriglyceridemia 02/13/2014   Diabetes mellitus, type 2 (HCC) 02/13/2014   Hernia of anterior abdominal wall 02/13/2014     Past Medical History:  Diagnosis Date   Breast cancer [facility]) 2013   right breast with radiation   Personal history of radiation  therapy    f/u breast cancer    Uterine cancer (HCC) 2010     Past Surgical History:  Procedure Laterality Date   BREAST BIOPSY Right 03/19/2015   US  guided biopsy - negative fat necrosis   BREAST BIOPSY Right 2013   INVASIVE LOBULAR CARCINOMA, NOTTINGHAM COMBINED HISTOLOGIC GRADE 1.    BREAST  LUMPECTOMY Right 2013   breast cancer with rad   EXCISION / BIOPSY BREAST / NIPPLE / DUCT Right ?   benign    Social History   Socioeconomic History   Marital status: Married    Spouse name: Not on file   Number of children: Not on file   Years of education: Not on file   Highest education level: Not on file  Occupational History   Not on file  Tobacco Use   Smoking status: Never   Smokeless tobacco: Never  Vaping Use   Vaping status: Never Used  Substance and Sexual Activity   Alcohol use: Not Currently    Alcohol/week: 0.0 standard drinks of alcohol   Drug use: Never   Sexual activity: Not on file  Other Topics Concern   Not on file  Social History Narrative   Not on file   Social Drivers of Health   Financial Resource Strain: Low Risk  (07/29/2024)   Received from [facility]   Overall Financial Resource Strain (CARDIA)    Difficulty of Paying Living Expenses: Not hard at all  Food Insecurity: No Food Insecurity (07/29/2024)   Received from [facility]   Hunger Vital Sign    Within the past 12 months, you worried that your food would run out before you got the money to buy more.: Never true    Within the past 12 months, the food you bought just didn't last and you didn't have money to get more.: Never true  Transportation Needs: No Transportation Needs (07/29/2024)   Received from [facility] - Transportation    In the past 12 months, has lack of transportation kept you from medical appointments or from getting medications?: No    Lack of Transportation (Non-Medical): No  Physical Activity: Not on file  Stress: Not on file  Social Connections: Not on file  Intimate Partner Violence: Not on file     Family History  Problem Relation Age of Onset   Breast cancer Sister 66   Breast cancer Sister 79     Current Outpatient Medications:    acetaminophen (TYLENOL) 500 MG tablet, Take 500 mg by mouth  every 6 (six) hours as needed. , Disp: , Rfl:    albuterol  (VENTOLIN  HFA) 108 (90 Base) MCG/ACT inhaler, Inhale 2 puffs into the lungs every 4 (four) hours as needed., Disp: 18 g, Rfl: 0   ALPRAZolam (XANAX) 0.5 MG tablet, Take 0.5 mg by mouth daily as needed. , Disp: , Rfl:    amLODipine (NORVASC) 5 MG tablet, Take 5 mg by mouth daily., Disp: , Rfl:    anastrozole  (ARIMIDEX ) 1 MG tablet, Take 1 tablet (1 mg total) by mouth daily., Disp: 90 tablet, Rfl: 3   aspirin EC 81 MG tablet, Take 81 mg by mouth daily. , Disp: , Rfl:    atenolol (TENORMIN) 50 MG tablet, Take 50 mg by mouth daily. , Disp: , Rfl:    imipramine (TOFRANIL) 10 MG tablet, Take 10 mg by mouth at bedtime., Disp: , Rfl:    Multiple Vitamins-Minerals (CENTRUM SILVER PO),  Take by mouth., Disp: , Rfl:    omeprazole (PRILOSEC) 20 MG capsule, Take 20 mg by mouth daily. , Disp: , Rfl:    simvastatin (ZOCOR) 40 MG tablet, Take 40 mg by mouth daily at 6 PM. , Disp: , Rfl:    traMADol (ULTRAM) 50 MG tablet, Take 50 mg by mouth., Disp: , Rfl:    benzonatate  (TESSALON ) 100 MG capsule, Take 2 capsules (200 mg total) by mouth every 8 (eight) hours. (Patient not taking: Reported on 09/28/2024), Disp: 21 capsule, Rfl: 0   calcium carbonate (OS-CAL) 600 MG TABS tablet, Take 600 mg by mouth. (Patient not taking: Reported on 09/28/2024), Disp: , Rfl:    Calcium Carbonate-Vitamin D3 600-400 MG-UNIT TABS, Take 2 capsules by mouth daily.  (Patient not taking: Reported on 09/28/2024), Disp: , Rfl:    enalapril (VASOTEC) 20 MG tablet, Take 20 mg by mouth daily.  (Patient not taking: Reported on 09/28/2024), Disp: , Rfl:    Spacer/Aero-Holding Chambers (AEROCHAMBER MV) inhaler, Use as instructed (Patient not taking: Reported on 09/28/2024), Disp: 1 each, Rfl: 2   Physical exam:  Vitals:   09/28/24 1327  Weight: 147 lb 3.2 oz (66.8 kg)  Height: 5' 5 (1.651 m)   Physical Exam Cardiovascular:     Rate and Rhythm: Normal rate and regular rhythm.      Heart sounds: Normal heart sounds.  Pulmonary:     Effort: Pulmonary effort is normal.     Breath sounds: Normal breath sounds.  Abdominal:     General: Bowel sounds are normal.     Palpations: Abdomen is soft.  Skin:    General: Skin is warm and dry.  Neurological:     Mental Status: She is alert and oriented to person, place, and time.    Breast exam was performed in seated and lying down position. Patient is status post left lumpectomy with a well-healed surgical scar. No evidence of any palpable masses. No evidence of axillary adenopathy. No evidence of any palpable masses or lumps in the right breast. No evidence of right axillary adenopathy        Latest Ref Rng & Units 09/21/2024   12:37 PM  CMP  Glucose 70 - 99 mg/dL 826   BUN 8 - 23 mg/dL 19   Creatinine 9.55 - 1.00 mg/dL 9.24   Sodium 864 - 854 mmol/L 136   Potassium 3.5 - 5.1 mmol/L 4.0   Chloride 98 - 111 mmol/L 97   CO2 22 - 32 mmol/L 25   Calcium 8.9 - 10.3 mg/dL 9.7   Total Protein 6.5 - 8.1 g/dL 7.4   Total Bilirubin 0.0 - 1.2 mg/dL 0.9   Alkaline Phos 38 - 126 U/L 57   AST 15 - 41 U/L 26   ALT 0 - 44 U/L 15       Latest Ref Rng & Units 09/21/2024   12:37 PM  CBC  WBC 4.0 - 10.5 K/uL 7.5   Hemoglobin 12.0 - 15.0 g/dL 85.6   Hematocrit 63.9 - 46.0 % 43.6   Platelets 150 - 400 K/uL 151     No images are attached to the encounter.  CT Angio Chest PE W and/or Wo Contrast Result Date: 09/21/2024 CLINICAL DATA:  Pulmonary embolism (PE) suspected, high prob; Abdominal pain, acute, nonlocalized. History of breast and uterine cancer. * Tracking Code: BO * EXAM: CT ANGIOGRAPHY CHEST CT ABDOMEN AND PELVIS WITH CONTRAST TECHNIQUE: Multidetector CT imaging of the chest was performed using the  standard protocol during bolus administration of intravenous contrast. Multiplanar CT image reconstructions and MIPs were obtained to evaluate the vascular anatomy. Multidetector CT imaging of the abdomen and pelvis was  performed using the standard protocol during bolus administration of intravenous contrast. RADIATION DOSE REDUCTION: This exam was performed according to the departmental dose-optimization program which includes automated exposure control, adjustment of the mA and/or kV according to patient size and/or use of iterative reconstruction technique. CONTRAST:  100mL OMNIPAQUE IOHEXOL 350 MG/ML SOLN COMPARISON:  None Available. FINDINGS: CTA CHEST FINDINGS Cardiovascular: No evidence of embolism to the proximal subsegmental pulmonary artery level. Mild cardiomegaly. No pericardial effusion. No aortic aneurysm. There are coronary artery calcifications, in keeping with coronary artery disease. There are also mild peripheral atherosclerotic vascular calcifications of thoracic aorta and its major branches. Mediastinum/Nodes: Visualized thyroid gland appears grossly unremarkable. No solid / cystic mediastinal masses. The esophagus is nondistended precluding optimal assessment. No axillary, mediastinal or hilar lymphadenopathy by size criteria. Lungs/Pleura: The central tracheo-bronchial tree is patent. There are patchy areas of linear, plate-like atelectasis and/or scarring throughout bilateral lungs. No mass or consolidation. No pleural effusion or pneumothorax. There are several sub 4 mm calcified and noncalcified nodules in the left lung (marked with electronic arrow sign on series 2006). Musculoskeletal: The visualized soft tissues of the chest wall are grossly unremarkable. No suspicious osseous lesions. There are mild multilevel degenerative changes in the visualized spine. Review of the MIP images confirms the above findings. CT ABDOMEN and PELVIS FINDINGS Hepatobiliary: The liver is normal in size. Non-cirrhotic configuration. No suspicious mass. These is mild diffuse hepatic steatosis. No intrahepatic bile duct dilation. There is mild prominence of the extrahepatic bile duct, most likely due to post cholecystectomy  status. Gallbladder is surgically absent. Pancreas: Unremarkable. No pancreatic ductal dilatation or surrounding inflammatory changes. Spleen: Within normal limits. No focal lesion. Adrenals/Urinary Tract: Adrenal glands are unremarkable. No suspicious renal mass. There are several scattered simple cysts throughout bilateral kidneys with largest arising from the left kidney lower pole, anteromedially measuring up to 3.0 x 3.1 cm. No nephroureterolithiasis or obstructive uropathy. Unremarkable urinary bladder. Stomach/Bowel: Moderate-to-large hiatal hernia noted containing majority of the stomach. No disproportionate dilation of the small or large bowel loops. No evidence of abnormal bowel wall thickening or inflammatory changes. The appendix was not visualized; however there is no acute inflammatory process in the right lower quadrant. There are multiple diverticula throughout the colon, without imaging signs of diverticulitis. Post hemorrhoidectomy changes noted in the lower rectum. Vascular/Lymphatic: No ascites or pneumoperitoneum. No abdominal or pelvic lymphadenopathy, by size criteria. No aneurysmal dilation of the major abdominal arteries. There are mild peripheral atherosclerotic vascular calcifications of the aorta and its major branches. Reproductive: The uterus is surgically absent. No large adnexal mass. Other: There are bilateral small fat containing inguinal hernias. The soft tissues and abdominal wall are otherwise unremarkable. Musculoskeletal: There is a lytic lesion with sclerotic rim in the anterior aspect of the T11 vertebral body measuring up to 1.3 x 2.1 cm. There are 2 additional smaller lesion in the posterior aspect of the T11 vertebral body extending into the pedicles. There are also additional sclerotic lesions in the pelvic bones (for example, series 5, images 45, 66, 76, etc.). These are incompletely characterized on the current exam but concerning for osseous metastases. Clinical  correlation and further evaluation with nuclear medicine bone scan or PET scan is recommended. There are mild - moderate multilevel degenerative changes in the visualized spine. Review of the  MIP images confirms the above findings. IMPRESSION: 1. No embolism to the proximal subsegmental pulmonary artery level. 2. No acute inflammatory process identified within the abdomen or pelvis. 3. There are several sclerotic osseous lesions, as described above, concerning for osseous metastases. No pathological fracture. 4. Multiple other nonacute observations, as described above. Electronically Signed   By: Ree Molt M.D.   On: 09/21/2024 15:25   CT ABDOMEN PELVIS W CONTRAST Result Date: 09/21/2024 CLINICAL DATA:  Pulmonary embolism (PE) suspected, high prob; Abdominal pain, acute, nonlocalized. History of breast and uterine cancer. * Tracking Code: BO * EXAM: CT ANGIOGRAPHY CHEST CT ABDOMEN AND PELVIS WITH CONTRAST TECHNIQUE: Multidetector CT imaging of the chest was performed using the standard protocol during bolus administration of intravenous contrast. Multiplanar CT image reconstructions and MIPs were obtained to evaluate the vascular anatomy. Multidetector CT imaging of the abdomen and pelvis was performed using the standard protocol during bolus administration of intravenous contrast. RADIATION DOSE REDUCTION: This exam was performed according to the departmental dose-optimization program which includes automated exposure control, adjustment of the mA and/or kV according to patient size and/or use of iterative reconstruction technique. CONTRAST:  100mL OMNIPAQUE IOHEXOL 350 MG/ML SOLN COMPARISON:  None Available. FINDINGS: CTA CHEST FINDINGS Cardiovascular: No evidence of embolism to the proximal subsegmental pulmonary artery level. Mild cardiomegaly. No pericardial effusion. No aortic aneurysm. There are coronary artery calcifications, in keeping with coronary artery disease. There are also mild peripheral  atherosclerotic vascular calcifications of thoracic aorta and its major branches. Mediastinum/Nodes: Visualized thyroid gland appears grossly unremarkable. No solid / cystic mediastinal masses. The esophagus is nondistended precluding optimal assessment. No axillary, mediastinal or hilar lymphadenopathy by size criteria. Lungs/Pleura: The central tracheo-bronchial tree is patent. There are patchy areas of linear, plate-like atelectasis and/or scarring throughout bilateral lungs. No mass or consolidation. No pleural effusion or pneumothorax. There are several sub 4 mm calcified and noncalcified nodules in the left lung (marked with electronic arrow sign on series 2006). Musculoskeletal: The visualized soft tissues of the chest wall are grossly unremarkable. No suspicious osseous lesions. There are mild multilevel degenerative changes in the visualized spine. Review of the MIP images confirms the above findings. CT ABDOMEN and PELVIS FINDINGS Hepatobiliary: The liver is normal in size. Non-cirrhotic configuration. No suspicious mass. These is mild diffuse hepatic steatosis. No intrahepatic bile duct dilation. There is mild prominence of the extrahepatic bile duct, most likely due to post cholecystectomy status. Gallbladder is surgically absent. Pancreas: Unremarkable. No pancreatic ductal dilatation or surrounding inflammatory changes. Spleen: Within normal limits. No focal lesion. Adrenals/Urinary Tract: Adrenal glands are unremarkable. No suspicious renal mass. There are several scattered simple cysts throughout bilateral kidneys with largest arising from the left kidney lower pole, anteromedially measuring up to 3.0 x 3.1 cm. No nephroureterolithiasis or obstructive uropathy. Unremarkable urinary bladder. Stomach/Bowel: Moderate-to-large hiatal hernia noted containing majority of the stomach. No disproportionate dilation of the small or large bowel loops. No evidence of abnormal bowel wall thickening or  inflammatory changes. The appendix was not visualized; however there is no acute inflammatory process in the right lower quadrant. There are multiple diverticula throughout the colon, without imaging signs of diverticulitis. Post hemorrhoidectomy changes noted in the lower rectum. Vascular/Lymphatic: No ascites or pneumoperitoneum. No abdominal or pelvic lymphadenopathy, by size criteria. No aneurysmal dilation of the major abdominal arteries. There are mild peripheral atherosclerotic vascular calcifications of the aorta and its major branches. Reproductive: The uterus is surgically absent. No large adnexal mass. Other: There are  bilateral small fat containing inguinal hernias. The soft tissues and abdominal wall are otherwise unremarkable. Musculoskeletal: There is a lytic lesion with sclerotic rim in the anterior aspect of the T11 vertebral body measuring up to 1.3 x 2.1 cm. There are 2 additional smaller lesion in the posterior aspect of the T11 vertebral body extending into the pedicles. There are also additional sclerotic lesions in the pelvic bones (for example, series 5, images 45, 66, 76, etc.). These are incompletely characterized on the current exam but concerning for osseous metastases. Clinical correlation and further evaluation with nuclear medicine bone scan or PET scan is recommended. There are mild - moderate multilevel degenerative changes in the visualized spine. Review of the MIP images confirms the above findings. IMPRESSION: 1. No embolism to the proximal subsegmental pulmonary artery level. 2. No acute inflammatory process identified within the abdomen or pelvis. 3. There are several sclerotic osseous lesions, as described above, concerning for osseous metastases. No pathological fracture. 4. Multiple other nonacute observations, as described above. Electronically Signed   By: Ree Molt M.D.   On: 09/21/2024 15:25    Assessment and plan- Patient is a 89 y.o. female with prior history of  stage I lobular breast cancer ER/PR positive HER2 negative in 2013 status postlumpectomy radiation and adjuvant endocrine therapy now referred for abnormal CT chest abdomen pelvis findings     Evaluation for possible bone metastases or other bone lesions CT scan showed a 1.3 cm spot at T11 and tiny spots in hip bones. Differential includes bone metastases from breast cancer or osteopenia. No other organ or lymph node involvement seen. Further evaluation required to determine lesion nature. Hormonal therapy considered if lesions are estrogen receptor-positive. - Order MRI of thoracic and lumbar spine. - Perform blood tests for breast cancer markers including CA 27-29 and CA 15-3 - Discuss case at tumor conference post-MRI.  History of right breast cancer, stage I, status post treatment Stage I right breast cancer treated with surgery, radiation, and hormonal therapy. Recurrence possible, could be estrogen receptor-positive or negative.  Chronic right subcostal abdominal pain Chronic pain evaluated with normal x-rays and blood work. Prednisone  ineffective for suspected costochondritis. No fractures on CT scan.  Follow-Up Follow-up plans discussed to accommodate her schedule. - Schedule MRI for the week after next. - Follow up after MRI and tumor conference discussion.    Thank you for this kind referral and the opportunity to participate in the care of this patient   Visit Diagnosis 1. Expansile bone lesions   2. History of breast cancer     Dr. Annah Skene, MD, MPH [facility] [phone] 09/28/2024

## 2024-09-28 NOTE — Patient Instructions (Signed)
 VISIT SUMMARY:  Today, we discussed your recent imaging findings and concerns about possible bone metastasis. You have a history of right breast cancer treated successfully in the past, but recent pain and imaging results require further evaluation.  YOUR PLAN:  -EVALUATION FOR POSSIBLE BONE METASTASES OR OTHER BONE LESIONS: The CT scan showed a 1.3 cm spot at T11 and tiny spots in your hip bones, which could be due to bone metastases from breast cancer or osteopenia. We need more tests to determine the nature of these lesions. We will order an MRI of your thoracic and lumbar spine and perform blood tests for breast cancer markers. Your case will be discussed at a tumor conference after the MRI results are available.  -HISTORY OF RIGHT BREAST CANCER, STAGE I, STATUS POST TREATMENT: You had stage I right breast cancer that was treated with surgery, radiation, and hormonal therapy. Although you completed treatment in 2018, there is a possibility of recurrence, which could be estrogen receptor-positive or negative.  -CHRONIC RIGHT SUBCOSTAL ABDOMINAL PAIN: You have been experiencing chronic pain under your rib cage. Previous x-rays and blood work were normal, and prednisone  did not help. The CT scan did not show any fractures, so we will continue to monitor this issue.  INSTRUCTIONS:  We will schedule an MRI for the week after next. Please follow up with us  after the MRI and the tumor conference discussion to review the results and determine the next steps.

## 2024-09-29 LAB — CANCER ANTIGEN 27.29: CA 27.29: 93.3 U/mL — ABNORMAL HIGH (ref 0.0–38.6)

## 2024-09-29 LAB — CANCER ANTIGEN 15-3: CA 15-3: 91.7 U/mL — ABNORMAL HIGH (ref 0.0–25.0)

## 2024-10-04 ENCOUNTER — Inpatient Hospital Stay

## 2024-10-08 ENCOUNTER — Telehealth: Payer: Self-pay

## 2024-10-08 DIAGNOSIS — C50911 Malignant neoplasm of unspecified site of right female breast: Secondary | ICD-10-CM

## 2024-10-08 NOTE — Telephone Encounter (Addendum)
 Per Dr. Melanee patient was discussed at Tumor Board waiting on MRI (currently scheduled for 11/7 at 11:00am and 11:30am).  Please contact son, as discussed the providers think they can biopsy one of those bone spots but are wanting the MRI first.  Next step is to schedule CT guided biopsy of T11 vertebral body around 11/8-11/9.  Order in system; waiting to hear from [place] regarding scheduling.    Outbound call to son Elsie; informed of above.  Caregiver verbalized understanding and informed will provide an update shortly after the MRI.

## 2024-10-09 NOTE — Telephone Encounter (Signed)
 Touched base with Dr. Melanee that if CT biopsy on 11/11 she would gave to hold her ASA 5 days prior so starting the morning of her MRI 11/7; okay to contact patient to hold the aspirin starting Friday morning 11/7.  Outbound call to son, informed of above; patient verbalized understanding.  Son that indicated that patient has a $300 copay for MRI's and didn't know if there was any assistance available / cheaper alternatives available.  Informed I would reach out and have someone loop back.  Messaged Malia who responded Hey! I honestly wouldn't be sure that would be something they would need to talk to with registration about. Their number is [phone].  Will contact tomorrow and inquire.

## 2024-10-12 ENCOUNTER — Ambulatory Visit
Admission: RE | Admit: 2024-10-12 | Discharge: 2024-10-12 | Disposition: A | Discharge status: Home / Self Care | Source: Ambulatory Visit | Attending: Oncology | Admitting: Oncology

## 2024-10-12 DIAGNOSIS — M898X9 Other specified disorders of bone, unspecified site: Secondary | ICD-10-CM | POA: Insufficient documentation

## 2024-10-12 MED ORDER — GADOBUTROL 1 MMOL/ML IV SOLN
7.0000 mL | Freq: Once | INTRAVENOUS | Status: AC | PRN
  Administered 2024-10-12: 7 mL via INTRAVENOUS

## 2024-10-16 NOTE — Telephone Encounter (Signed)
 Dr. Melanee indicated patient is able to tolerate laying prone and tolerate sedation for procedure; advised I contact son and confirm this information with him.   Per Clarita Dr Karalee is normally quick. When scheduled it brings in 1 hour, but he normally is done in approx 30 mins or less. If patient patient agrees Friday 11/14 at 8:30a an arrive at 7:30a H&V entrance and NPO after MN. Outbound call to son informed of above.  Son said she had a little bit of a hard time laying prone for the MRI and they had to restart the scan.  He will speak with his mother and give us  a call back tomorrow.  Son did indicate as far as he knows patient is still holding ASA.

## 2024-10-17 ENCOUNTER — Telehealth: Payer: Self-pay

## 2024-10-17 NOTE — Telephone Encounter (Signed)
 Per Dr. Melanee I can see her on 12/2 to discuss next steps if she can't go for biopsy.  Outbound call to son; informed of above.  Son agreed to visit on 12/2 but would like information on MRI's of whether or not it confirmed cancer.  Informed I would touch base with Dr. Melanee and one of us  would be in touch shortly.  Also informed scheduling will contact him to coordinate appointment for 11/06/24.

## 2024-10-17 NOTE — Telephone Encounter (Signed)
 Received secure chat yesterday 10/17/24 stating son Elsie called and would like a return call please.SABRA

## 2024-10-17 NOTE — Telephone Encounter (Signed)
 Two voicemails received from [facility] on 10/16/24 at 11:59 am and today 10/17/24 at 8:19am asking if the MRI's have resulted on our end.  Best call back number is [phone].  Outbound call spoke to Sherrill to confirm results were indeed received for both MRI's.

## 2024-10-17 NOTE — Telephone Encounter (Signed)
 Outbound call to son Elsie; indicated There is Absolutely no way she can lay on her stomach. Informed I would let Dr. Melanee know and follow up shortly.

## 2024-10-19 ENCOUNTER — Ambulatory Visit

## 2024-10-19 NOTE — Telephone Encounter (Signed)
 Yes MRI does show multiple bony lesions concerning for metastatic disease but we do not have tissue diagnosis to prove what type

## 2024-10-19 NOTE — Telephone Encounter (Signed)
 Patient's son left voice message today 10/19/24 at 10:06am asking about MRI Results. When I spoke to son on 11/12 he agreed to visit on 12/2 but would like information on MRI's of whether or not it confirmed cancer. Son is requesting another call back to review MRI; best contact number is [phone].

## 2024-10-19 NOTE — Telephone Encounter (Signed)
 Per Dr. Melanee Yes MRI does show multiple bony lesions concerning for metastatic disease but we do not have tissue diagnosis to prove what type. Voice message left.

## 2024-10-22 ENCOUNTER — Telehealth: Payer: Self-pay

## 2024-10-22 NOTE — Telephone Encounter (Signed)
 Voicemail received from son today 10/22/24 at 1:52pm agreeing to biopsy with sedation and requested a call back on when we can get that scheduled.

## 2024-10-22 NOTE — Telephone Encounter (Signed)
 Per Dr. Melanee Per Dr. Melanee We can give her a dose of ativan before the mri to relax her.  If he is agreeable to that we can give her Ativan before the MRI and following that interventional radiology did see that they can pursue biopsy when we discussed at tumor board. Detailed voice message left.

## 2024-10-22 NOTE — Telephone Encounter (Signed)
 Voicemail received from son Fri 10/19/24 at 2:12pm returning nurse call; best call back number is [phone].  Outbound call to son (next appointment scheduled 12/2 @10 :30am). Outbound call; informed of above. Son says patient does not want to go through treatment but if it's similar to breast cancer and patient was treated with pills she may consider that.  Son is inquiring if patient can be sedated for biopsy; if so patient can possibly undergo biopsy.  Informed I would follow up with Dr. Melanee and call before end of day; caregiver verbalized understanding.

## 2024-10-23 ENCOUNTER — Inpatient Hospital Stay: Admitting: Oncology

## 2024-10-26 NOTE — Telephone Encounter (Addendum)
 Per Brittney Chaney, I think I can fit her in on Mon 12/1 with Dr Karalee - everyday that he is here, he has a large case in the mornings but I think that I can put her on at 11a an arrive at 10a (for CT guided biopsy of T11 vertebral body). Her last dose of ASA 81mg  would be Tues 11/25. Let me know. Thanks.  Outbound call to son; detailed voice message left.

## 2024-10-26 NOTE — Telephone Encounter (Signed)
 Voicemail received from son Elsie today 10/26/24 at 11:06am agreeing to new proposed date and time of 11/05/24 appointment time 11am with 10am arrival time. On voicemail he did verbalize patient will hold aspirin after Tues 11/25.

## 2024-10-30 ENCOUNTER — Other Ambulatory Visit: Payer: Self-pay

## 2024-10-30 NOTE — Telephone Encounter (Signed)
 Created in error

## 2024-10-30 NOTE — Progress Notes (Signed)
 Karalee Wilkie POUR, MD sent to [facility] Clarita RAMAN She has severe multifocal osseous metastatic disease.  Hx breast and uterine cancer.  We could schedule for transpedicular bx of T11 vs L5 (decision made at time of biopsy based on imaging) if the patient can tolerate laying prone and is healthy enough for sedation at 89. Schedule for me if Dr. Melanee feels the patient can tolerate the procedure.  HKM

## 2024-10-31 NOTE — Progress Notes (Signed)
 Patient for CT guided T11 bone biopsy on Mon 11/05/24, I called and spoke with the patient's son, Elsie on the phone and gave pre-procedure instructions. Elsie was made aware to have the patient here at 10a, last dose of ASA 81mg  was Tues 10/30/24, NPO after MN prior to procedure as well as driver post procedure/recovery/discharge. Elsie stated understanding.  Called 10/31/24

## 2024-11-05 ENCOUNTER — Ambulatory Visit
Admission: RE | Admit: 2024-11-05 | Discharge: 2024-11-05 | Disposition: A | Source: Ambulatory Visit | Attending: Oncology

## 2024-11-05 ENCOUNTER — Telehealth: Payer: Self-pay | Admitting: Oncology

## 2024-11-05 ENCOUNTER — Other Ambulatory Visit: Payer: Self-pay

## 2024-11-05 VITALS — BP 134/76 | HR 81 | Temp 98.2°F | Resp 19 | Wt 130.0 lb

## 2024-11-05 DIAGNOSIS — C7951 Secondary malignant neoplasm of bone: Secondary | ICD-10-CM | POA: Diagnosis not present

## 2024-11-05 DIAGNOSIS — R109 Unspecified abdominal pain: Secondary | ICD-10-CM | POA: Insufficient documentation

## 2024-11-05 DIAGNOSIS — Z8542 Personal history of malignant neoplasm of other parts of uterus: Secondary | ICD-10-CM | POA: Insufficient documentation

## 2024-11-05 DIAGNOSIS — Z853 Personal history of malignant neoplasm of breast: Secondary | ICD-10-CM | POA: Insufficient documentation

## 2024-11-05 DIAGNOSIS — Z923 Personal history of irradiation: Secondary | ICD-10-CM | POA: Insufficient documentation

## 2024-11-05 DIAGNOSIS — M899 Disorder of bone, unspecified: Secondary | ICD-10-CM

## 2024-11-05 DIAGNOSIS — C50911 Malignant neoplasm of unspecified site of right female breast: Secondary | ICD-10-CM

## 2024-11-05 LAB — CBC
HCT: 41.3 % (ref 36.0–46.0)
Hemoglobin: 13.7 g/dL (ref 12.0–15.0)
MCH: 30.6 pg (ref 26.0–34.0)
MCHC: 33.2 g/dL (ref 30.0–36.0)
MCV: 92.2 fL (ref 80.0–100.0)
Platelets: 164 K/uL (ref 150–400)
RBC: 4.48 MIL/uL (ref 3.87–5.11)
RDW: 13.2 % (ref 11.5–15.5)
WBC: 5.9 K/uL (ref 4.0–10.5)
nRBC: 0 % (ref 0.0–0.2)

## 2024-11-05 LAB — PROTIME-INR
INR: 0.9 (ref 0.8–1.2)
Prothrombin Time: 13.1 s (ref 11.4–15.2)

## 2024-11-05 MED ORDER — MIDAZOLAM HCL (PF) 2 MG/2ML IJ SOLN
INTRAMUSCULAR | Status: AC | PRN
  Administered 2024-11-05: .5 mg via INTRAVENOUS
  Administered 2024-11-05: 1 mg via INTRAVENOUS

## 2024-11-05 MED ORDER — FENTANYL CITRATE (PF) 100 MCG/2ML IJ SOLN
INTRAMUSCULAR | Status: DC
  Filled 2024-11-05: qty 4

## 2024-11-05 MED ORDER — FENTANYL CITRATE (PF) 100 MCG/2ML IJ SOLN
INTRAMUSCULAR | Status: AC | PRN
  Administered 2024-11-05: 25 ug via INTRAVENOUS
  Administered 2024-11-05: 50 ug via INTRAVENOUS

## 2024-11-05 MED ORDER — SODIUM CHLORIDE 0.9 % IV SOLN: INTRAVENOUS | Status: DC

## 2024-11-05 MED ORDER — MIDAZOLAM HCL 2 MG/2ML IJ SOLN
INTRAMUSCULAR | Status: DC
  Filled 2024-11-05: qty 4

## 2024-11-05 NOTE — Telephone Encounter (Signed)
 Patients son called to reschedule her appointment from tomorrow. She Is having biopsy today and she  doesn't feel like she will want to come tomorrow. Also they don't think the results will be back. Please call Elsie at [phone] to reschedule.

## 2024-11-05 NOTE — H&P (Signed)
 Chief Complaint:  T11 Bone Lesion concerning for malignancy  Procedure: Bone Lesion Biopsy  Referring Provider(s): Dr. Annah Skene  Supervising Physician: Karalee Beat  Patient Status: [facility] - Out-pt  History of Present Illness: Brittney Chaney is a 89 y.o. female with a history of uterine cancer in 2010 and right sided breast cancer in 2013 s/p lumpectomy and sentinel lymph node biopsy. She underwent adjuvant radiation therapy and took Arimidex  for 5 years until 2109. In October, the patient presented to the ED with complaints of abdominal pain. CT Angio at that time revealed a lytic lesion with sclerotic rim in the anterior aspect of the T11 vertebral body as well as 2 additional smaller lesions in the posterior aspect of the the T11 vertebral body extending into the pedicles. She was referred to Dr. Skene for further management and underwent follow up MR imaging which confirmed concern for metastatic disease throughout the thoracic spine. She was referred to IR for possible biopsy of the bone lesion with tentative plans to restart hormone therapy should the lesions return estrogen receptor positive.   Pleasant patient resting in bed with her son at the bedside. States that she has been doing well overall. Reports intermittent abdominal pain that is typically worse on the right side under her rib cage. She feels the pain most when she is sitting up; however, reports an improvement in her pain in the last 3 days. States this occasionally radiates across her abdomen over to the left side. She denies any back pain, fevers/chills, chest pain, shortness of breath, nausea/vomiting, or changes in bowel/bladder habits. Her son reports no plans for intervention beyond possible hormone therapy at this point. Last dose ASA on 11/25. NPO since midnight. All questions and concerns answered at the bedside.   Patient is Full Code  Past Medical History:  Diagnosis Date   Breast cancer [facility]) 2013   right  breast with radiation   Personal history of radiation therapy    f/u breast cancer    Uterine cancer ([facility]) 2010    Past Surgical History:  Procedure Laterality Date   BREAST BIOPSY Right 03/19/2015   US  guided biopsy - negative fat necrosis   BREAST BIOPSY Right 2013   INVASIVE LOBULAR CARCINOMA, NOTTINGHAM COMBINED HISTOLOGIC GRADE 1.    BREAST LUMPECTOMY Right 2013   breast cancer with rad   CHOLECYSTECTOMY     EXCISION / BIOPSY BREAST / NIPPLE / DUCT Right ?   benign    Allergies: Patient has no known allergies.  Medications: Prior to Admission medications   Medication Sig Start Date End Date Taking? Authorizing Provider  acetaminophen (TYLENOL) 500 MG tablet Take 500 mg by mouth every 6 (six) hours as needed.    Yes [provider]  amLODipine (NORVASC) 5 MG tablet Take 5 mg by mouth daily. 01/03/24  Yes [provider]  atenolol (TENORMIN) 50 MG tablet Take 50 mg by mouth daily.    Yes [provider]  enalapril (VASOTEC) 20 MG tablet Take 20 mg by mouth daily.   Yes [provider]  omeprazole (PRILOSEC) 20 MG capsule Take 20 mg by mouth daily.    Yes [provider]  simvastatin (ZOCOR) 40 MG tablet Take 40 mg by mouth daily at 6 PM.    Yes [provider]  traMADol (ULTRAM) 50 MG tablet Take 50 mg by mouth. 09/26/24  Yes [provider]  albuterol  (VENTOLIN  HFA) 108 (90 Base) MCG/ACT inhaler Inhale 2 puffs  into the lungs every 4 (four) hours as needed. Patient not taking: Reported on 11/05/2024 04/25/21   Bernardino Ditch, NP  ALPRAZolam (XANAX) 0.5 MG tablet Take 0.5 mg by mouth daily as needed.  Patient not taking: Reported on 11/05/2024 12/30/15   [provider]  anastrozole  (ARIMIDEX ) 1 MG tablet Take 1 tablet (1 mg total) by mouth daily. Patient not taking: Reported on 11/05/2024 01/04/17   Corcoran, Melissa C, MD  aspirin EC 81 MG tablet Take 81 mg by mouth daily.     [provider]   benzonatate  (TESSALON ) 100 MG capsule Take 2 capsules (200 mg total) by mouth every 8 (eight) hours. Patient not taking: Reported on 09/28/2024 04/25/21   Bernardino Ditch, NP  calcium carbonate (OS-CAL) 600 MG TABS tablet Take 600 mg by mouth. Patient not taking: Reported on 09/28/2024    [provider]  Calcium Carbonate-Vitamin D3 600-400 MG-UNIT TABS Take 2 capsules by mouth daily.  Patient not taking: Reported on 09/28/2024    [provider]  imipramine (TOFRANIL) 10 MG tablet Take 10 mg by mouth at bedtime. Patient not taking: Reported on 11/05/2024 07/30/24 07/30/25  [provider]  Multiple Vitamins-Minerals (CENTRUM SILVER PO) Take by mouth.    [provider]  Spacer/Aero-Holding Chambers (AEROCHAMBER MV) inhaler Use as instructed Patient not taking: Reported on 09/28/2024 04/25/21   Bernardino Ditch, NP     Family History  Problem Relation Age of Onset   Breast cancer Sister 5   Breast cancer Sister 98    Social History   Socioeconomic History   Marital status: Married    Spouse name: Not on file   Number of children: Not on file   Years of education: Not on file   Highest education level: Not on file  Occupational History   Not on file  Tobacco Use   Smoking status: Never   Smokeless tobacco: Never  Vaping Use   Vaping status: Never Used  Substance and Sexual Activity   Alcohol use: Not Currently    Alcohol/week: 0.0 standard drinks of alcohol   Drug use: Never   Sexual activity: Not Currently  Other Topics Concern   Not on file  Social History Narrative   Not on file   Social Drivers of Health   Financial Resource Strain: Low Risk  (07/29/2024)   Received from [facility]   Overall Financial Resource Strain (CARDIA)    Difficulty of Paying Living Expenses: Not hard at all  Food Insecurity: No Food Insecurity (09/28/2024)   Hunger Vital Sign    Worried About Running Out of Food in the Last Year: Never true     Ran Out of Food in the Last Year: Never true  Transportation Needs: No Transportation Needs (09/28/2024)   PRAPARE - Administrator, Civil Service (Medical): No    Lack of Transportation (Non-Medical): No  Physical Activity: Not on file  Stress: Not on file  Social Connections: Not on file    Review of Systems  Gastrointestinal:  Positive for abdominal pain (intermittent, right worse than left).  Patient denies any headache, chest pain, shortness of breath, N/V, or fever/chills. All other systems are negative.   Vital Signs: BP (!) 150/74   Pulse 71   Temp 97.7 F (36.5 C) (Oral)   Resp 19   Wt 130 lb (59 kg)   SpO2 95%   BMI 21.63 kg/m    Physical Exam Vitals reviewed.  Constitutional:  Appearance: Normal appearance.  HENT:     Mouth/Throat:     Mouth: Mucous membranes are moist.     Pharynx: Oropharynx is clear.  Cardiovascular:     Rate and Rhythm: Normal rate and regular rhythm.     Heart sounds: Normal heart sounds.  Abdominal:     General: Abdomen is flat.     Palpations: Abdomen is soft.     Tenderness: There is no abdominal tenderness.  Musculoskeletal:        General: Normal range of motion.  Skin:    General: Skin is warm and dry.  Neurological:     Mental Status: She is alert and oriented to person, place, and time.  Psychiatric:        Behavior: Behavior normal.     Imaging: MR THORACIC SPINE W WO CONTRAST Result Date: 10/16/2024 CLINICAL DATA:  History of breast cancer.  Abnormal CT scan. EXAM: MRI THORACIC WITHOUT AND WITH CONTRAST TECHNIQUE: Multiplanar and multiecho pulse sequences of the thoracic spine were obtained without and with intravenous contrast. CONTRAST:  7mL GADAVIST  GADOBUTROL  1 MMOL/ML IV SOLN COMPARISON:  CT 09/21/2024 FINDINGS: Alignment: Thoracic kyphosis. Scoliotic curvature convex to the left. Vertebrae: Scout view of the cervical spine shows evidence of metastatic lesions involving at least the C5 and C7  vertebral bodies. No sign of pathologic fracture or extraosseous extension on the nondedicated cervical portion of the study. Cord: No metastatic cord lesion. See below regarding retropulsion of a pathologic fracture at T5. Paraspinal and other soft tissues: No visible pleural effusion. Disc levels: T1: No visible lesion. T2: Metastatic lesion of the anterior inferior corner of the vertebral body. No pathologic fracture or extraosseous extension. T3: No metastatic lesion. T4: Edema and enhancement at the anterior inferior corner of the vertebral body could be a metastatic lesion or could be reactive edema related to the T5 fracture. T5: Pathologic fracture of the vertebral body with loss of height of 50-60%. Retropulsion of bone measuring 5 mm with encroachment upon the spinal canal. Effacement of the ventral subarachnoid space and slight indentation of the cord. Definite cord compression is not demonstrated. Question some foraminal extension of tumor on the left at T4-5. T6: Probable metastatic disease within the left side of the vertebral body. Small metastatic lesion of the posterior superior corner on the right. T7: Metastatic disease within the vertebral body including a dominant lesion at the left posterior vertebral body with involvement of the pedicle. No definite extraosseous extension. T8: Metastatic disease with small lesions in the vertebral body and a lesion in the right pedicle and posterior elements. No definite extraosseous disease. T9: Small vertebral body metastatic lesion. T10: Small vertebral body metastatic lesion. Test assists within the right transverse process. T11: Metastatic disease affecting the vertebral body including a lesion in the anterior vertebral body measuring up to 2 cm in size. Lesion of the right pedicle with mild expansion but no definite extraosseous tumor. T12: Degenerative change versus early metastatic disease at the posterior superior corner of the T12 vertebral body.  IMPRESSION: 1. Widespread metastatic disease to the thoracic spine as outlined above. The most significant finding is a pathologic fracture at T5 with loss of height of 50-60%. Retropulsion of bone measuring 5 mm with effacement of the ventral subarachnoid space and slight indentation of the cord. Definite cord compression is not demonstrated. Question some foraminal extension of tumor on the left at T4-5. 2. Scout view of the cervical spine shows evidence of metastatic lesions involving at  least the C5 and C7 vertebral bodies. No sign of pathologic fracture or extraosseous extension on the nondedicated cervical portion of the study. 3. These results will be called to the ordering clinician or representative by the Radiologist Assistant, and communication documented in the PACS or [employer]. Electronically Signed   By: Oneil Officer M.D.   On: 10/16/2024 11:53   MR Lumbar Spine W Wo Contrast Result Date: 10/16/2024 CLINICAL DATA:  Bone abnormality. CT scan shows lesion of the anterior T11 vertebral body smaller lesions in the T11 vertebral body more posteriorly. History of breast cancer. EXAM: MRI LUMBAR SPINE WITHOUT AND WITH CONTRAST TECHNIQUE: Multiplanar and multiecho pulse sequences of the lumbar spine were obtained without and with intravenous contrast. CONTRAST:  7mL GADAVIST  GADOBUTROL  1 MMOL/ML IV SOLN COMPARISON:  CT studies 09/21/2024 FINDINGS: Segmentation:  5 lumbar type vertebral bodies. Alignment: Scoliotic curvature convex to the right. Degenerative anterolisthesis at L5-S1 of 2 mm. Vertebrae: In the lumbar region, there are numerous T1 dark T2 bright enhancing lesions consistent with osseous metastatic disease. L1: 1 cm lesion in the right side of the vertebral body. 1 cm lesion in the posterior elements on the right. L2: 6 mm lesion in the posterior vertebral body. 11 mm lesion of the posterior elements on the right L3: Superior endplate Schmorl's node without suspicion of underlying  metastatic lesion. Possible 3 mm enhancing focus within the posterior elements on the left. L4: 1 cm enhancing lesion at the inferior endplate on the right. L5: Enhancing lesion within the anterior and left side of the vertebral body with extension into the left pedicle. Maximal dimension 2.5 cm. No extraosseous extension. Sacrum: Scattered metastatic lesions affecting S1 and S2. No extraosseous tumor. Conus medullaris and cauda equina: Conus extends to the L1 level. Conus and cauda equina appear normal. Paraspinal and other soft tissues: Negative Disc levels: T12-L1: Normal L1-2: Normal L2-3: Bulging of the disc. Facet degeneration more pronounced on the left. Mild stenosis of the left lateral recess and intervertebral foramen on the left. L3-4: Bulging of the disc. Mild facet and ligamentous hypertrophy. Mild stenosis of both lateral recesses. L4-5: Mild bulging of the disc. Bilateral facet degeneration and hypertrophy. Mild to moderate multifactorial stenosis of the canal and lateral recesses. L5-S1: Chronic facet arthropathy with anterolisthesis of 2 mm. Bulging of the disc. No compressive stenosis. The facet arthritis could be painful. IMPRESSION: 1. Numerous enhancing lesions throughout the lumbar spine and sacrum consistent with osseous metastatic disease. No extraosseous extension. 2. Degenerative changes as outlined above. Mild to moderate multifactorial stenosis of the canal and lateral recesses at L4-5. Mild stenosis of the left lateral recess and intervertebral foramen on the left at L2-3. Mild stenosis of both lateral recesses at L3-4. 3. Facet arthropathy at L5-S1 with 2 mm of anterolisthesis. No compressive stenosis. The facet arthritis could be painful. Electronically Signed   By: Oneil Officer M.D.   On: 10/16/2024 11:45    Labs:  CBC: Recent Labs    09/21/24 1237 09/28/24 1410 11/05/24 0959  WBC 7.5 7.8 5.9  HGB 14.3 12.2 13.7  HCT 43.6 37.2 41.3  PLT 151 156 164    COAGS: Recent  Labs    11/05/24 0959  INR 0.9    BMP: Recent Labs    09/21/24 1237 09/28/24 1411  NA 136 129*  K 4.0 4.3  CL 97* 95*  CO2 25 25  GLUCOSE 173* 105*  BUN 19 23  CALCIUM 9.7 8.7*  CREATININE 0.75 1.02*  GFRNONAA >60 51*    LIVER FUNCTION TESTS: Recent Labs    09/21/24 1237 09/28/24 1411  BILITOT 0.9 0.7  AST 26 19  ALT 15 14  ALKPHOS 57 61  PROT 7.4 6.2*  ALBUMIN 4.1 3.6    TUMOR MARKERS: No results for input(s): AFPTM, CEA, CA199, CHROMGRNA in the last 8760 hours.  Assessment and Plan:  Lytic lesions of the thoracic spine: Brittney Chaney is a 89 y.o. female with a history of uterine cancer and estrogen receptor positive right sided breast cancer recently found to have several lytic lesions of her thoracic spine concerning for malignancy. She [facility] to [facility] department for an image-guided bone biopsy with Dr. VEAR Lent. Procedure to be performed under moderate sedation.  -NPO since midnight -Last dose ASA on 11/25 -Labs WNL  Risks and benefits of bone biopsy was discussed with the patient and/or patient's family including, but not limited to bleeding, infection, damage to adjacent structures or low yield requiring additional tests.  All of the questions were answered and there is agreement to proceed.  Consent signed and in chart.   Thank you for allowing our service to participate in Brittney Chaney 's care.    Electronically Signed: Glennon CHRISTELLA Bal, PA-C   11/05/2024, 10:59 AM     I spent a total of  40 Minutes in face to face in clinical consultation, greater than 50% of which was counseling/coordinating care for T1! Lesion Bone Biopsy.

## 2024-11-05 NOTE — Telephone Encounter (Signed)
 Please move her appointment with me to 11/13/2024

## 2024-11-06 ENCOUNTER — Inpatient Hospital Stay: Admitting: Oncology

## 2024-11-08 LAB — SURGICAL PATHOLOGY

## 2024-11-13 ENCOUNTER — Inpatient Hospital Stay: Attending: Oncology | Admitting: Oncology

## 2024-11-13 ENCOUNTER — Other Ambulatory Visit (HOSPITAL_COMMUNITY): Payer: Self-pay

## 2024-11-13 ENCOUNTER — Other Ambulatory Visit: Payer: Self-pay

## 2024-11-13 ENCOUNTER — Telehealth: Payer: Self-pay | Admitting: Pharmacy Technician

## 2024-11-13 ENCOUNTER — Telehealth: Payer: Self-pay | Admitting: Pharmacist

## 2024-11-13 VITALS — BP 142/67 | HR 95 | Temp 96.7°F | Resp 16 | Ht 65.0 in | Wt 132.8 lb

## 2024-11-13 DIAGNOSIS — Z923 Personal history of irradiation: Secondary | ICD-10-CM | POA: Insufficient documentation

## 2024-11-13 DIAGNOSIS — Z1721 Progesterone receptor positive status: Secondary | ICD-10-CM | POA: Diagnosis not present

## 2024-11-13 DIAGNOSIS — Z79811 Long term (current) use of aromatase inhibitors: Secondary | ICD-10-CM | POA: Insufficient documentation

## 2024-11-13 DIAGNOSIS — Z803 Family history of malignant neoplasm of breast: Secondary | ICD-10-CM | POA: Diagnosis not present

## 2024-11-13 DIAGNOSIS — Z8542 Personal history of malignant neoplasm of other parts of uterus: Secondary | ICD-10-CM | POA: Insufficient documentation

## 2024-11-13 DIAGNOSIS — Z170 Estrogen receptor positive status [ER+]: Secondary | ICD-10-CM | POA: Diagnosis not present

## 2024-11-13 DIAGNOSIS — Z79899 Other long term (current) drug therapy: Secondary | ICD-10-CM | POA: Insufficient documentation

## 2024-11-13 DIAGNOSIS — C7951 Secondary malignant neoplasm of bone: Secondary | ICD-10-CM | POA: Insufficient documentation

## 2024-11-13 DIAGNOSIS — M8448XA Pathological fracture, other site, initial encounter for fracture: Secondary | ICD-10-CM | POA: Insufficient documentation

## 2024-11-13 DIAGNOSIS — C50911 Malignant neoplasm of unspecified site of right female breast: Secondary | ICD-10-CM | POA: Diagnosis present

## 2024-11-13 DIAGNOSIS — M898X9 Other specified disorders of bone, unspecified site: Secondary | ICD-10-CM

## 2024-11-13 DIAGNOSIS — R5383 Other fatigue: Secondary | ICD-10-CM | POA: Insufficient documentation

## 2024-11-13 DIAGNOSIS — Z7189 Other specified counseling: Secondary | ICD-10-CM | POA: Diagnosis not present

## 2024-11-13 DIAGNOSIS — Z1732 Human epidermal growth factor receptor 2 negative status: Secondary | ICD-10-CM | POA: Insufficient documentation

## 2024-11-13 MED ORDER — LETROZOLE 2.5 MG PO TABS: 2.5000 mg | ORAL_TABLET | Freq: Every day | ORAL | 3 refills | Status: DC

## 2024-11-13 NOTE — Telephone Encounter (Signed)
 Per patient's son they have decided not to proceed with Ibrance at this time.  Send patient will start letrozole  now and they may consider adding on Ibrance in the future. No further action needed from oral chemo team.

## 2024-11-13 NOTE — Telephone Encounter (Signed)
 Oral Oncology Patient Advocate Encounter   Patient's son and the patient have decided to only do Letrozole  for now.   They know to follow up with Dr.Rao if their decision changes.   Brittney Chaney (Patty) Chet Burnet, CPhT  [facility], [place], [facility] - Oral Chemotherapy Patient Advocate Specialist III Phone: [phone]  Fax: [phone]

## 2024-11-13 NOTE — Telephone Encounter (Signed)
 Oral Oncology Patient Advocate Encounter   New authorization   Received notification that prior authorization for Brittney Chaney is required.   PA submitted on CMM via [employer] [id number] Status is pending     Lenin Kuhnle (Patty) Chet Burnet, CPhT  [facility], [place], [facility] - Oral Chemotherapy Patient Advocate Specialist III Phone: [phone]  Fax: [phone]

## 2024-11-13 NOTE — Telephone Encounter (Signed)
 Oral Oncology Patient Advocate Encounter   Patient's son and the patient have decided to only do Letrozole  for now.   They know to follow up with Dr.Rao if their decision changes.   Loleta Frommelt (Patty) Chet Burnet, CPhT  [facility], [place] [place], [facility] - Oral Chemotherapy Patient Advocate Specialist III Phone: [phone]  Fax: [phone]

## 2024-11-13 NOTE — Telephone Encounter (Signed)
 Oral Oncology Patient Advocate Encounter   Began application for assistance for Ibrance through [employer].   Application will be submitted upon completion of necessary supporting documentation.   [employer] [facility] phone number [phone].   Patient must sign up for the medicare prescription payment plan before [employer] will consider her for their free medication program.   Pending MD signature and patient signature/income.  Brittney Chaney (Patty) Chet Burnet, CPhT  [facility], [place] [place], [facility] - Oral Chemotherapy Patient Advocate Specialist III Phone: [phone]  Fax: [phone]

## 2024-11-13 NOTE — Telephone Encounter (Signed)
 Clinical Pharmacist Practitioner Encounter   Received new prescription for Ibrance (palbociclib) for the treatment of metastatic breast cancer, ER/PR positive, HER2 negative, in conjunction with letrozole , planned duration until disease progression or unacceptable drug toxicity.  Planned start 12/11/2024.  CMP from 09/28/2024 assessed, no relevant lab abnormalities. Prescription dose and frequency assessed.  MD starting patient on a reduced dose to ensure tolerability.  Current medication list in Epic reviewed, a few DDIs with palbociclib identified: Alprazolam: Palbociclib may increase the serum concentration of alprazolam.  Monitor for increased alprazolam effects and toxicities (sedation, lethargy).  No baseline dose adjustment needed Simvastatin: Cyclic may increase serum concentration of simvastatin.  Monitor for increased simvastatin side effects, particularly myotoxicity (eg, weakness, myalgia).  Baseline dose adjustment needed  Evaluated chart and no patient barriers to medication adherence identified.   Prescription has been e-scribed to the [facility] for benefits analysis and approval.  Oral Oncology Clinic will continue to follow for insurance authorization, copayment issues, initial counseling and start date.   Brittney Chaney, PharmD, BCOP, CPP Hematology/Oncology Clinical Pharmacist [facility] Oral Chemotherapy Navigation Clinic [phone]  11/13/2024 2:09 PM

## 2024-11-13 NOTE — Telephone Encounter (Signed)
 Oral Oncology Patient Advocate Encounter  Prior Authorization for [facility] has been approved.    PA# [id number] Effective dates: 11/13/2024 through 12/05/2024  Patients co-pay is $1,965.31.    Katarina Riebe (Patty) Chet Burnet, CPhT  [facility], Zelda Salmon, [facility] - Oral Chemotherapy Patient Advocate Specialist III Phone: [phone]  Fax: [phone]

## 2024-11-19 NOTE — Progress Notes (Signed)
 Hematology/Oncology Consult note [facility]  Telephone:([phone] [phone]  Patient Care Team: Auston Reyes BIRCH, MD as PCP - General (Internal Medicine) Melanee Annah BROCKS, MD as Consulting Physician (Oncology)   Name of the patient: Brittney Chaney  [id number]  [birth year]   Date of visit: 11/19/2024  Diagnosis-metastatic ER positive HER2 negative breast cancer with bone metastases  Chief complaint/ Reason for visit-discuss biopsy results and further management  Heme/Onc history: patient is a 89 year old female with a past medical history significant for breast cancer back in 2013 and uterine cancer in 2010.  Patient has previously seen Dr. Rudell for her right breast cancer s/p lumpectomy and sentinel lymph node biopsy in May 2013.  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  She completed adjuvant radiation therapy and took Arimidex  for 5 years up until July 2019.   Patient was in the ER on 09/21/2024 with symptoms of abdominal pain and underwent CT angio chest which did not show any evidence of PE.  Lytic lesion noted in T11 vertebral body measuring up to 1.3 x 2.1 cm.  Additional smaller lesion in the posterior aspect of the T11 vertebral body.  Additional sclerotic lesions in the pelvic bones concerning for osseous metastases.  No other inflammatory or malignant process noted in abdomen and pelvis.   Patient had MRI of the thoracic and lumbar spine which showed numerous enhancing lesions throughout the lumbar spine and sacrum consistent with osseous metastatic disease.  Patient had T11 lesion bone biopsy which was consistent with metastatic carcinoma of breast primary/lobular carcinoma.  Cells were positive for CK and GATA3 and negative for CDX2 SOX10 and E-cadherin.  ER 50% positive moderately strong staining intensity, PR 50% positive moderately strong staining intensity  and HER2 -0.  Interval history-  Brittney Chaney is a 89 year old female with metastatic ER/PR-positive, HER2-negative breast cancer who presents for oncology consultation and initiation of systemic therapy.  She was recently hospitalized after imaging revealed extensive osseous metastases involving the cervical, thoracic, and lumbar spine, sacrum, and pelvis. MRI confirmed widespread bone involvement without visceral organ metastases. Biopsy of a bone lesion demonstrated metastatic breast carcinoma, ER 50% positive, PR 50% positive, and HER2 negative. She previously completed five years of hormone therapy following her initial breast cancer diagnosis in 2013.  Her initial symptoms included pain under the right ribs, which has resolved. Imaging identified a pathologic fracture at T5 secondary to bone metastasis. Currently, she describes intermittent heaviness and fatigue in her back but denies significant pain and has not required analgesics for the past two weeks. She reports no new or progressive symptoms related to bone metastases.  She discontinued calcium supplementation approximately one month ago due to pill burden; continuation was recommended. She prefers to minimize clinic visits.      ECOG PS- 3 Pain scale- 0 Opioid associated constipation- no  Review of systems- Review of Systems  Constitutional:  Positive for malaise/fatigue. Negative for chills, fever and weight loss.  HENT:  Negative for congestion, ear discharge and nosebleeds.   Eyes:  Negative for blurred vision.  Respiratory:  Negative for cough, hemoptysis, sputum production, shortness of breath and wheezing.   Cardiovascular:  Negative for chest pain, palpitations, orthopnea and claudication.  Gastrointestinal:  Negative for abdominal pain, blood in stool, constipation, diarrhea, heartburn, melena, nausea and vomiting.  Genitourinary:  Negative for dysuria, flank pain, frequency, hematuria and urgency.  Musculoskeletal:  Negative for back pain, joint pain and myalgias.  Skin:  Negative for rash.  Neurological:  Negative for dizziness, tingling, focal weakness, seizures, weakness and headaches.  Endo/Heme/Allergies:  Does not bruise/bleed easily.  Psychiatric/Behavioral:  Negative for depression and suicidal ideas. The patient does not have insomnia.       Allergies[1]   Past Medical History:  Diagnosis Date   Breast cancer [facility]) 2013   right breast with radiation   Personal history of radiation therapy    f/u breast cancer    Uterine cancer (HCC) 2010     Past Surgical History:  Procedure Laterality Date   BREAST BIOPSY Right 03/19/2015   US  guided biopsy - negative fat necrosis   BREAST BIOPSY Right 2013   INVASIVE LOBULAR CARCINOMA, NOTTINGHAM COMBINED HISTOLOGIC GRADE 1.    BREAST LUMPECTOMY Right 2013   breast cancer with rad   CHOLECYSTECTOMY     EXCISION / BIOPSY BREAST / NIPPLE / DUCT Right ?   benign    Social History   Socioeconomic History   Marital status: Married    Spouse name: Not on file   Number of children: Not on file   Years of education: Not on file   Highest education level: Not on file  Occupational History   Not on file  Tobacco Use   Smoking status: Never   Smokeless tobacco: Never  Vaping Use   Vaping status: Never Used  Substance and Sexual Activity   Alcohol use: Not Currently    Alcohol/week: 0.0 standard drinks of alcohol   Drug use: Never   Sexual activity: Not Currently  Other Topics Concern   Not on file  Social History Narrative   Not on file   Social Drivers of Health   Tobacco Use: Low Risk  (11/13/2024)   Received from [facility]   Patient History    Smoking Tobacco Use: Never    Smokeless Tobacco Use: Never    Passive Exposure: Not on file  Financial Resource Strain: Low Risk  (07/29/2024)   Received from [facility]   Overall Financial Resource Strain (CARDIA)    Difficulty of Paying Living  Expenses: Not hard at all  Food Insecurity: No Food Insecurity (09/28/2024)   Epic    Worried About Running Out of Food in the Last Year: Never true    Ran Out of Food in the Last Year: Never true  Transportation Needs: No Transportation Needs (09/28/2024)   Epic    Lack of Transportation (Medical): No    Lack of Transportation (Non-Medical): No  Physical Activity: Not on file  Stress: Not on file  Social Connections: Not on file  Intimate Partner Violence: Not At Risk (09/28/2024)   Epic    Fear of Current or Ex-Partner: No    Emotionally Abused: No    Physically Abused: No    Sexually Abused: No  Depression (PHQ2-9): Low Risk (11/13/2024)   Depression (PHQ2-9)    PHQ-2 Score: 0  Alcohol Screen: Not on file  Housing: Low Risk  (11/13/2024)   Received from [facility]   Epic    In the last 12 months, was there a time when you were not able to pay the mortgage or rent on time?: No    In the past 12 months, how many times have you moved where you were living?: 0    At any time in the past 12 months, were you homeless or living in  a shelter (including now)?: No  Utilities: Not At Risk (09/28/2024)   Epic    Threatened with loss of utilities: No  Health Literacy: Not on file    Family History  Problem Relation Age of Onset   Breast cancer Sister 69   Breast cancer Sister 110    Current Medications[2]  Physical exam:  Vitals:   11/13/24 1102 11/13/24 1108  BP: (!) 145/65 (!) 142/67  Pulse: 95   Resp: 16   Temp: (!) 96.7 F (35.9 C)   TempSrc: Tympanic   SpO2: 100%   Weight: 132 lb 12.8 oz (60.2 kg)   Height: 5' 5 (1.651 m)    Physical Exam Constitutional:      Comments: Sitting in a wheelchair.  Appears in no acute distress  Cardiovascular:     Rate and Rhythm: Normal rate and regular rhythm.     Heart sounds: Normal heart sounds.  Pulmonary:     Effort: Pulmonary effort is normal.     Breath sounds: Normal breath sounds.  Abdominal:      General: Bowel sounds are normal.     Palpations: Abdomen is soft.  Skin:    General: Skin is warm and dry.  Neurological:     Mental Status: She is alert and oriented to person, place, and time.      I have personally reviewed labs listed below:    Latest Ref Rng & Units 09/28/2024    2:11 PM  CMP  Glucose 70 - 99 mg/dL 894   BUN 8 - 23 mg/dL 23   Creatinine 9.55 - 1.00 mg/dL 8.97   Sodium 864 - 854 mmol/L 129   Potassium 3.5 - 5.1 mmol/L 4.3   Chloride 98 - 111 mmol/L 95   CO2 22 - 32 mmol/L 25   Calcium 8.9 - 10.3 mg/dL 8.7   Total Protein 6.5 - 8.1 g/dL 6.2   Total Bilirubin 0.0 - 1.2 mg/dL 0.7   Alkaline Phos 38 - 126 U/L 61   AST 15 - 41 U/L 19   ALT 0 - 44 U/L 14       Latest Ref Rng & Units 11/05/2024    9:59 AM  CBC  WBC 4.0 - 10.5 K/uL 5.9   Hemoglobin 12.0 - 15.0 g/dL 86.2   Hematocrit 63.9 - 46.0 % 41.3   Platelets 150 - 400 K/uL 164    I have personally reviewed Radiology images listed below: No images are attached to the encounter.  CT BONE TROCAR/NEEDLE BIOPSY DEEP Result Date: 11/05/2024 INDICATION: 89 year old female with multiple subtle lytic lesions within the vertebra. She presents to target the lesions in the T11 vertebral body. EXAM: CT-guided deep bone biopsy MEDICATIONS: None. ANESTHESIA/SEDATION: Moderate (conscious) sedation was employed during this procedure. A total of Versed  1.5 mg and Fentanyl  75 mcg was administered intravenously by the radiology nurse. Total intra-service moderate Sedation Time: 16 minutes. The patient's level of consciousness and vital signs were monitored continuously by radiology nursing throughout the procedure under my direct supervision. COMPLICATIONS: None immediate. PROCEDURE: Informed written consent was obtained from the patient after a thorough discussion of the procedural risks, benefits and alternatives. All questions were addressed. Maximal Sterile Barrier Technique was utilized including caps, mask, sterile gowns,  sterile gloves, sterile drape, hand hygiene and skin antiseptic. A timeout was performed prior to the initiation of the procedure. A planning axial CT scan was performed. The subtle lytic lesion in the posterior and anterior aspect of the T11  vertebral body were identified. A right-sided approach was selected. The skin was marked and local anesthesia was attained by infiltration with 1% lidocaine. A small dermatotomy was made. Under intermittent CT guidance, an 11 gauge trocar needle was carefully advanced and positioned at the margin of the pedicle. The 9 gauge coaxial bone lesion needle was then advanced through both lytic lesions using the OnControl drill. CT imaging confirmed accurate placement of the biopsy needle. Biopsy specimens were placed in formalin and delivered to pathology for further analysis. The needles were removed. Post biopsy imaging demonstrates no complication. IMPRESSION: Successful CT-guided core biopsy of T11 bone lesion. Electronically Signed   By: Wilkie Lent M.D.   On: 11/05/2024 14:45     Assessment and plan- Patient is a 89 y.o. female with prior history of right breast cancer in 2013 now presenting with metastatic ER/PR positive HER2 negative breast cancer with bone metastases  Assessment and Plan    Stage IV ER/PR positive, HER2 negative breast cancer with bone metastases Newly diagnosed, incurable, hormone receptor positive, HER2 negative metastatic breast cancer with extensive osseous involvement. No visceral organ metastases. Palliative, life-prolonging, and quality-of-life preserving approach appropriate. Combination oral endocrine therapy recommended as first-line treatment. Deferred additional molecular testing unless disease progression occurs. - Prescribed letrozole  to be initiated immediately. - Planned initiation of palbociclib (Ibrance) 75 mg daily, starting one month after letrozole , with home delivery from specialty pharmacy. - Arranged pharmacy review  of Ibrance prior to initiation. - Scheduled blood work two weeks after starting Ibrance, again two weeks later, then monthly. - Scheduled follow-up in eight weeks. - Deferred additional molecular testing on biopsy specimen unless disease progression occurs. - Advised continuation of calcium supplementation.  Pathologic fracture of thoracic vertebra due to bone metastasis Sustained pathologic fracture of T5 vertebra secondary to bone metastasis. No significant pain currently. No indication for radiation therapy. Monthly IV zoledronic acid infusion discussed to reduce future fracture risk, considering dental health and quality of life. - Deferred radiation therapy as she is not experiencing significant pain at the fracture site. - Discussed monthly IV zoledronic acid (Zometa) infusion to reduce fracture risk; deferred scheduling pending further discussion with her and her family. - Discussed need for dental evaluation prior to zoledronic acid due to risk of osteonecrosis of the jaw; left decision to proceed with or without dental clearance to her and her family. - Advised to contact the office if she wishes to proceed with bone-strengthening infusion therapy.         Visit Diagnosis 1. Carcinoma of right breast metastatic to bone (HCC)   2. Goals of care, counseling/discussion      Dr. Annah Skene, MD, MPH [facility] [phone] 11/19/2024 9:17 AM   Addendum: Patient and her son was initially agreeable to proceeding with Ibrance 1 month after starting letrozole .  However later they have changed her mind and they wish to continue with letrozole  alone  Dr. Annah Skene, MD, MPH [facility] at [facility] [phone] 11/19/2024 9:29 AM                 [1] No Known Allergies [2]  Current Outpatient Medications:    acetaminophen  (TYLENOL ) 500 MG tablet, Take 500 mg by mouth every 6 (six) hours as needed. , Disp: , Rfl:     amLODipine (NORVASC) 5 MG tablet, Take 5 mg by mouth daily., Disp: , Rfl:    aspirin EC 81 MG tablet, Take 81 mg by mouth  daily. , Disp: , Rfl:    atenolol (TENORMIN) 50 MG tablet, Take 50 mg by mouth daily. , Disp: , Rfl:    docusate sodium (COLACE) 50 MG capsule, Take 50 mg by mouth as needed for mild constipation., Disp: , Rfl:    enalapril (VASOTEC) 20 MG tablet, Take 20 mg by mouth daily., Disp: , Rfl:    letrozole  (FEMARA ) 2.5 MG tablet, Take 1 tablet (2.5 mg total) by mouth daily., Disp: 30 tablet, Rfl: 3   Multiple Vitamins-Minerals (CENTRUM SILVER PO), Take by mouth., Disp: , Rfl:    omeprazole (PRILOSEC) 20 MG capsule, Take 20 mg by mouth daily. , Disp: , Rfl:    simvastatin (ZOCOR) 40 MG tablet, Take 40 mg by mouth daily at 6 PM. , Disp: , Rfl:    traMADol (ULTRAM) 50 MG tablet, Take 50 mg by mouth as needed for moderate pain (pain score 4-6)., Disp: , Rfl:    albuterol  (VENTOLIN  HFA) 108 (90 Base) MCG/ACT inhaler, Inhale 2 puffs into the lungs every 4 (four) hours as needed. (Patient not taking: Reported on 11/13/2024), Disp: 18 g, Rfl: 0   ALPRAZolam (XANAX) 0.5 MG tablet, Take 0.5 mg by mouth daily as needed.  (Patient not taking: Reported on 11/13/2024), Disp: , Rfl:    benzonatate  (TESSALON ) 100 MG capsule, Take 2 capsules (200 mg total) by mouth every 8 (eight) hours. (Patient not taking: Reported on 11/13/2024), Disp: 21 capsule, Rfl: 0   calcium carbonate (OS-CAL) 600 MG TABS tablet, Take 600 mg by mouth. (Patient not taking: Reported on 11/13/2024), Disp: , Rfl:    Calcium Carbonate-Vitamin D3 600-400 MG-UNIT TABS, Take 2 capsules by mouth daily.  (Patient not taking: Reported on 11/13/2024), Disp: , Rfl:    imipramine (TOFRANIL) 10 MG tablet, Take 10 mg by mouth at bedtime. (Patient not taking: Reported on 11/13/2024), Disp: , Rfl:    palbociclib (IBRANCE) 75 MG tablet, Take 75 mg by mouth daily. Take for 21 days on, 7 days off, repeat every 28 days., Disp: , Rfl:     Spacer/Aero-Holding Chambers (AEROCHAMBER MV) inhaler, Use as instructed (Patient not taking: Reported on 11/13/2024), Disp: 1 each, Rfl: 2

## 2024-12-25 ENCOUNTER — Telehealth: Payer: Self-pay

## 2024-12-25 ENCOUNTER — Inpatient Hospital Stay: Attending: Oncology

## 2024-12-25 ENCOUNTER — Other Ambulatory Visit: Payer: Self-pay | Admitting: Oncology

## 2024-12-25 DIAGNOSIS — Z7189 Other specified counseling: Secondary | ICD-10-CM

## 2024-12-25 DIAGNOSIS — C419 Malignant neoplasm of bone and articular cartilage, unspecified: Secondary | ICD-10-CM | POA: Insufficient documentation

## 2024-12-25 LAB — CBC WITH DIFFERENTIAL (CANCER CENTER ONLY)
Abs Immature Granulocytes: 0.02 K/uL (ref 0.00–0.07)
Basophils Absolute: 0 K/uL (ref 0.0–0.1)
Basophils Relative: 1 %
Eosinophils Absolute: 0.1 K/uL (ref 0.0–0.5)
Eosinophils Relative: 1 %
HCT: 40 % (ref 36.0–46.0)
Hemoglobin: 13.2 g/dL (ref 12.0–15.0)
Immature Granulocytes: 0 %
Lymphocytes Relative: 27 %
Lymphs Abs: 1.4 K/uL (ref 0.7–4.0)
MCH: 30.6 pg (ref 26.0–34.0)
MCHC: 33 g/dL (ref 30.0–36.0)
MCV: 92.6 fL (ref 80.0–100.0)
Monocytes Absolute: 0.4 K/uL (ref 0.1–1.0)
Monocytes Relative: 8 %
Neutro Abs: 3.3 K/uL (ref 1.7–7.7)
Neutrophils Relative %: 63 %
Platelet Count: 151 K/uL (ref 150–400)
RBC: 4.32 MIL/uL (ref 3.87–5.11)
RDW: 13.2 % (ref 11.5–15.5)
WBC Count: 5.3 K/uL (ref 4.0–10.5)
nRBC: 0 % (ref 0.0–0.2)

## 2024-12-25 LAB — CMP (CANCER CENTER ONLY)
ALT: 8 U/L (ref 0–44)
AST: 19 U/L (ref 15–41)
Albumin: 4.1 g/dL (ref 3.5–5.0)
Alkaline Phosphatase: 68 U/L (ref 38–126)
Anion gap: 12 (ref 5–15)
BUN: 13 mg/dL (ref 8–23)
CO2: 24 mmol/L (ref 22–32)
Calcium: 9.3 mg/dL (ref 8.9–10.3)
Chloride: 101 mmol/L (ref 98–111)
Creatinine: 0.78 mg/dL (ref 0.44–1.00)
GFR, Estimated: >60 mL/min
Glucose, Bld: 110 mg/dL — ABNORMAL HIGH (ref 70–99)
Potassium: 4.1 mmol/L (ref 3.5–5.1)
Sodium: 136 mmol/L (ref 135–145)
Total Bilirubin: 0.4 mg/dL (ref 0.0–1.2)
Total Protein: 6.2 g/dL — ABNORMAL LOW (ref 6.5–8.1)

## 2024-12-25 NOTE — Telephone Encounter (Signed)
 Per Dr. Melanee she discussed zometa infusions can cause the risk of osteonecrosis of jaw and discussed with family whether or not they wanted to proceed with treatment.  If they'd like to proceed with treatment w/o dental clearance it would be documented as such.  She got her labs today so we can use those labs to get Zometa as long as she gets that within a week and I can see her on that day.  If you do not come within the 7 day period we would need to collect a CMP prior to receiving Zometa and depending on how kidney function returns will determine whether or not she can get treatment or not.  Dr. Melanee will see you the day you get Zometa.    Per Olam Pinal no shara for Zometa.  Outbound call spoke to son, informed of above. He would like to keep 2/4 appointment scheduled as is;  he is aware she would need to have labs done that morning again as scheduled and infusion encounter added after seeing Dr. Melanee. Son indicated he would like to proceed with zometa infusions w/ no dental clearance prior.  Reviewed risks including osteonecrosis of jaw; son indicated he would like to proceed as it was recommended by Dr. Auston also.  Also informed per Olam Pinal no prior auth needed.

## 2024-12-25 NOTE — Telephone Encounter (Signed)
 Patient's son Brittney Chaney presented to clinic today upset indicating that his mother was supposed to be receiving a monthly infusion via IV to strengthen bones.  Informed patient I would touch base with Dr. Melanee, did not see a treatment plan currently in the chart.  Best contact number [phone]; informed I would give him a call later today with an update.

## 2025-01-09 ENCOUNTER — Inpatient Hospital Stay: Admitting: Oncology

## 2025-01-09 ENCOUNTER — Inpatient Hospital Stay

## 2025-01-09 ENCOUNTER — Inpatient Hospital Stay: Attending: Oncology

## 2025-01-09 ENCOUNTER — Encounter: Payer: Self-pay | Admitting: Oncology

## 2025-01-09 VITALS — BP 133/63 | HR 70 | Temp 97.7°F | Resp 20 | Ht 65.0 in | Wt 133.0 lb

## 2025-01-09 DIAGNOSIS — C419 Malignant neoplasm of bone and articular cartilage, unspecified: Secondary | ICD-10-CM

## 2025-01-09 DIAGNOSIS — Z79899 Other long term (current) drug therapy: Secondary | ICD-10-CM

## 2025-01-09 DIAGNOSIS — Z7983 Long term (current) use of bisphosphonates: Secondary | ICD-10-CM | POA: Diagnosis not present

## 2025-01-09 DIAGNOSIS — Z5181 Encounter for therapeutic drug level monitoring: Secondary | ICD-10-CM

## 2025-01-09 DIAGNOSIS — Z7189 Other specified counseling: Secondary | ICD-10-CM

## 2025-01-09 LAB — CBC WITH DIFFERENTIAL (CANCER CENTER ONLY)
Abs Immature Granulocytes: 0.01 10*3/uL (ref 0.00–0.07)
Basophils Absolute: 0 10*3/uL (ref 0.0–0.1)
Basophils Relative: 1 %
Eosinophils Absolute: 0.1 10*3/uL (ref 0.0–0.5)
Eosinophils Relative: 1 %
HCT: 40.6 % (ref 36.0–46.0)
Hemoglobin: 13.1 g/dL (ref 12.0–15.0)
Immature Granulocytes: 0 %
Lymphocytes Relative: 23 %
Lymphs Abs: 1 10*3/uL (ref 0.7–4.0)
MCH: 30.2 pg (ref 26.0–34.0)
MCHC: 32.3 g/dL (ref 30.0–36.0)
MCV: 93.5 fL (ref 80.0–100.0)
Monocytes Absolute: 0.4 10*3/uL (ref 0.1–1.0)
Monocytes Relative: 9 %
Neutro Abs: 3.1 10*3/uL (ref 1.7–7.7)
Neutrophils Relative %: 66 %
Platelet Count: 153 10*3/uL (ref 150–400)
RBC: 4.34 MIL/uL (ref 3.87–5.11)
RDW: 13.2 % (ref 11.5–15.5)
WBC Count: 4.6 10*3/uL (ref 4.0–10.5)
nRBC: 0 % (ref 0.0–0.2)

## 2025-01-09 LAB — CMP (CANCER CENTER ONLY)
ALT: 8 U/L (ref 0–44)
AST: 18 U/L (ref 15–41)
Albumin: 4 g/dL (ref 3.5–5.0)
Alkaline Phosphatase: 62 U/L (ref 38–126)
Anion gap: 13 (ref 5–15)
BUN: 15 mg/dL (ref 8–23)
CO2: 24 mmol/L (ref 22–32)
Calcium: 9.4 mg/dL (ref 8.9–10.3)
Chloride: 100 mmol/L (ref 98–111)
Creatinine: 0.8 mg/dL (ref 0.44–1.00)
GFR, Estimated: >60 mL/min
Glucose, Bld: 86 mg/dL (ref 70–99)
Potassium: 4 mmol/L (ref 3.5–5.1)
Sodium: 137 mmol/L (ref 135–145)
Total Bilirubin: 0.4 mg/dL (ref 0.0–1.2)
Total Protein: 6.1 g/dL — ABNORMAL LOW (ref 6.5–8.1)

## 2025-01-09 MED ORDER — ZOLEDRONIC ACID 4 MG/5ML IV CONC
3.3000 mg | INTRAVENOUS | Status: DC
  Administered 2025-01-09: 3.3 mg via INTRAVENOUS
  Filled 2025-01-09: qty 4.13

## 2025-01-09 MED ORDER — SODIUM CHLORIDE 0.9 % IV SOLN
INTRAVENOUS | Status: DC
  Filled 2025-01-09: qty 250

## 2025-01-09 NOTE — Progress Notes (Signed)
 "    Hematology/Oncology Consult note [facility]  [phone] [phone]  Patient Care Team: Auston Reyes BIRCH, MD as PCP - General (Internal Medicine) Melanee Annah BROCKS, MD as Consulting Physician (Oncology)   Name of the patient: Brittney Chaney  [id number]  [birth year]   Date of visit: 01/09/25  Diagnosis- metastatic ER positive HER2 negative breast cancer with bone metastases    Chief complaint/ Reason for visit-routine follow-up of breast cancer on letrozole   Heme/Onc history: patient is a 89 year old female with a past medical history significant for breast cancer back in 2013 and uterine cancer in 2010.  Patient has previously seen Dr. Rudell for her right breast cancer s/p lumpectomy and sentinel lymph node biopsy in May 2013.  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  She completed adjuvant radiation therapy and took Arimidex  for 5 years up until July 2019.   Patient was in the ER on 09/21/2024 with symptoms of abdominal pain and underwent CT angio chest which did not show any evidence of PE.  Lytic lesion noted in T11 vertebral body measuring up to 1.3 x 2.1 cm.  Additional smaller lesion in the posterior aspect of the T11 vertebral body.  Additional sclerotic lesions in the pelvic bones concerning for osseous metastases.  No other inflammatory or malignant process noted in abdomen and pelvis.   Patient had MRI of the thoracic and lumbar spine which showed numerous enhancing lesions throughout the lumbar spine and sacrum consistent with osseous metastatic disease.  Patient had T11 lesion bone biopsy which was consistent with metastatic carcinoma of breast primary/lobular carcinoma.  Cells were positive for CK and GATA3 and negative for CDX2 SOX10 and E-cadherin.  ER 50% positive moderately strong staining intensity, PR 50% positive moderately strong staining  intensity and HER2 -0.    Interval history-  History of Present Illness   Brittney Chaney is a 89 year old female with metastatic hormone receptor-positive breast cancer with osseous involvement who presents for oncology follow-up and monitoring of tumor markers.  She has metastatic breast cancer with bone metastases, currently managed with letrozole  since November 2025. Ibrance has not been initiated per prior discussion and preference. Disease status is monitored with serial tumor markers (CA27.29 and CA15-3), which were elevated prior to starting letrozole .  She remains generally well, without generalized pain. She describes intermittent, deep pain localized to her leg, not involving joints or muscles, and not relieved by rubbing. The pain is variable, with no symptoms yesterday and none today.      History of Present Illness    ECOG PS- 2 Pain scale- 0  Review of systems- Review of Systems  Constitutional:  Positive for malaise/fatigue. Negative for chills, fever and weight loss.  HENT:  Negative for congestion, ear discharge and nosebleeds.   Eyes:  Negative for blurred vision.  Respiratory:  Negative for cough, hemoptysis, sputum production, shortness of breath and wheezing.   Cardiovascular:  Negative for chest pain, palpitations, orthopnea and claudication.  Gastrointestinal:  Negative for abdominal pain, blood in stool, constipation, diarrhea, heartburn, melena, nausea and vomiting.  Genitourinary:  Negative for dysuria, flank pain, frequency, hematuria and urgency.  Musculoskeletal:  Negative for back pain, joint pain and myalgias.  Skin:  Negative for rash.  Neurological:  Negative for dizziness, tingling, focal weakness, seizures, weakness and headaches.  Endo/Heme/Allergies:  Does not bruise/bleed easily.  Psychiatric/Behavioral:  Negative for depression and suicidal ideas.  The patient does not have insomnia.       Allergies[1]   Past Medical History:  Diagnosis Date    Breast cancer [facility]) 2013   right breast with radiation   Personal history of radiation therapy    f/u breast cancer    Uterine cancer (HCC) 2010     Past Surgical History:  Procedure Laterality Date   BREAST BIOPSY Right 03/19/2015   US  guided biopsy - negative fat necrosis   BREAST BIOPSY Right 2013   INVASIVE LOBULAR CARCINOMA, NOTTINGHAM COMBINED HISTOLOGIC GRADE 1.    BREAST LUMPECTOMY Right 2013   breast cancer with rad   CHOLECYSTECTOMY     EXCISION / BIOPSY BREAST / NIPPLE / DUCT Right ?   benign    Social History   Socioeconomic History   Marital status: Married    Spouse name: Not on file   Number of children: Not on file   Years of education: Not on file   Highest education level: Not on file  Occupational History   Not on file  Tobacco Use   Smoking status: Never   Smokeless tobacco: Never  Vaping Use   Vaping status: Never Used  Substance and Sexual Activity   Alcohol use: Not Currently    Alcohol/week: 0.0 standard drinks of alcohol   Drug use: Never   Sexual activity: Not Currently  Other Topics Concern   Not on file  Social History Narrative   Not on file   Social Drivers of Health   Tobacco Use: Low Risk (01/09/2025)   Patient History    Smoking Tobacco Use: Never    Smokeless Tobacco Use: Never    Passive Exposure: Not on file  Financial Resource Strain: Low Risk  (07/29/2024)   Received from [facility]   Overall Financial Resource Strain (CARDIA)    Difficulty of Paying Living Expenses: Not hard at all  Food Insecurity: No Food Insecurity (09/28/2024)   Epic    Worried About Radiation Protection Practitioner of Food in the Last Year: Never true    Ran Out of Food in the Last Year: Never true  Transportation Needs: No Transportation Needs (09/28/2024)   Epic    Lack of Transportation (Medical): No    Lack of Transportation (Non-Medical): No  Physical Activity: Not on file  Stress: Not on file  Social Connections: Not on file  Intimate  Partner Violence: Not At Risk (09/28/2024)   Epic    Fear of Current or Ex-Partner: No    Emotionally Abused: No    Physically Abused: No    Sexually Abused: No  Depression (PHQ2-9): Low Risk (01/09/2025)   Depression (PHQ2-9)    PHQ-2 Score: 0  Alcohol Screen: Not on file  Housing: Low Risk  (11/13/2024)   [facility] from [facility]   Epic    In the last 12 months, was there a time when you were not able to pay the mortgage or rent on time?: No    In the past 12 months, how many times have you moved where you were living?: 0    At any time in the past 12 months, were you homeless or living in a shelter (including now)?: No  Utilities: Not At Risk (09/28/2024)   Epic    Threatened with loss of utilities: No  Health Literacy: Not on file    Family History  Problem Relation Age of Onset   Breast cancer Sister 46   Breast cancer Sister  70    Current Medications[2]  Physical exam:  Vitals:   01/09/25 1120 01/09/25 1125  BP: (!) 142/55 133/63  Pulse: 70   Resp: 20   Temp: 97.7 F (36.5 C)   TempSrc: Tympanic   SpO2: 97%   Weight: 133 lb (60.3 kg)   Height: 5' 5 (1.651 m)    Physical Exam Constitutional:      Comments: Sitting in a wheelchair.  Appears in no acute distress  Cardiovascular:     Rate and Rhythm: Normal rate and regular rhythm.     Heart sounds: Normal heart sounds.  Pulmonary:     Effort: Pulmonary effort is normal.     Breath sounds: Normal breath sounds.  Skin:    General: Skin is warm and dry.  Neurological:     Mental Status: She is alert and oriented to person, place, and time.      I have personally reviewed labs listed below:    Latest Ref Rng & Units 01/09/2025   10:50 AM  CMP  Glucose 70 - 99 mg/dL 86   BUN 8 - 23 mg/dL 15   Creatinine 9.55 - 1.00 mg/dL 9.19   Sodium 864 - 854 mmol/L 137   Potassium 3.5 - 5.1 mmol/L 4.0   Chloride 98 - 111 mmol/L 100   CO2 22 - 32 mmol/L 24   Calcium 8.9 - 10.3 mg/dL 9.4   Total  Protein 6.5 - 8.1 g/dL 6.1   Total Bilirubin 0.0 - 1.2 mg/dL 0.4   Alkaline Phos 38 - 126 U/L 62   AST 15 - 41 U/L 18   ALT 0 - 44 U/L 8       Latest Ref Rng & Units 01/09/2025   10:50 AM  CBC  WBC 4.0 - 10.5 K/uL 4.6   Hemoglobin 12.0 - 15.0 g/dL 86.8   Hematocrit 63.9 - 46.0 % 40.6   Platelets 150 - 400 K/uL 153       Assessment and plan- Patient is a 89 y.o. female with history of metastatic ER positive HER2 negative breast cancer with bone only metastases presently on letrozole  here for a routine follow-up  Assessment and Plan    Metastatic hormone receptor-positive breast cancer with bone metastases Managed with letrozole , tolerated for two months. Palbociclib not initiated by choice. Asymptomatic except for mild leg pain.  Baseline tumor markers elevated; ongoing surveillance planned. Imaging intervals extended.   Patient will receive Zometa  today.  renal function adequate. Discussed bone infusion risks. - Discussed risks of osteonecrosis of the jaw and flu-like reactions. - Ordered monitoring of tumor markers (CA27.29 and CA15-3). - Scheduled PET scan for May or June, 5-6 months post-letrozole  initiation. - Planned follow-up visits every three months with bone infusions. - Assessed pain; no acute intervention required.         Visit Diagnosis 1. Malignant neoplasm of bone with metastases (HCC)   2. High risk medication use   3. Encounter for monitoring zoledronate therapy      Dr. Annah Skene, MD, MPH [facility] [phone] 01/09/2025 12:21 PM                   [1] No Known Allergies [2]  Current Outpatient Medications:    acetaminophen  (TYLENOL ) 500 MG tablet, Take 500 mg by mouth every 6 (six) hours as needed. , Disp: , Rfl:    amLODipine (NORVASC) 5 MG tablet, Take 5 mg by mouth daily., Disp: , Rfl:  anastrozole  (ARIMIDEX ) 1 MG tablet, Take 1 mg by mouth., Disp: , Rfl:    aspirin EC 81 MG tablet, Take 81 mg by  mouth daily. , Disp: , Rfl:    atenolol (TENORMIN) 50 MG tablet, Take 50 mg by mouth daily. , Disp: , Rfl:    Calcium Carbonate-Vitamin D3 600-400 MG-UNIT TABS, Take 2 capsules by mouth daily. , Disp: , Rfl:    docusate sodium (COLACE) 50 MG capsule, Take 50 mg by mouth as needed for mild constipation., Disp: , Rfl:    enalapril (VASOTEC) 20 MG tablet, Take 20 mg by mouth daily., Disp: , Rfl:    Multiple Vitamins-Minerals (CENTRUM SILVER PO), Take by mouth., Disp: , Rfl:    omeprazole (PRILOSEC) 20 MG capsule, Take 20 mg by mouth daily. , Disp: , Rfl:    traMADol (ULTRAM) 50 MG tablet, Take 50 mg by mouth as needed for moderate pain (pain score 4-6)., Disp: , Rfl:    albuterol  (VENTOLIN  HFA) 108 (90 Base) MCG/ACT inhaler, Inhale 2 puffs into the lungs every 4 (four) hours as needed. (Patient not taking: Reported on 11/13/2024), Disp: 18 g, Rfl: 0   ALPRAZolam (XANAX) 0.5 MG tablet, Take 0.5 mg by mouth daily as needed.  (Patient not taking: Reported on 11/13/2024), Disp: , Rfl:    benzonatate  (TESSALON ) 100 MG capsule, Take 2 capsules (200 mg total) by mouth every 8 (eight) hours. (Patient not taking: Reported on 11/13/2024), Disp: 21 capsule, Rfl: 0   calcium carbonate (OS-CAL) 600 MG TABS tablet, Take 600 mg by mouth. (Patient not taking: Reported on 11/13/2024), Disp: , Rfl:    imipramine (TOFRANIL) 10 MG tablet, Take 10 mg by mouth at bedtime. (Patient not taking: Reported on 11/13/2024), Disp: , Rfl:    palbociclib (IBRANCE) 75 MG tablet, Take 75 mg by mouth daily. Take for 21 days on, 7 days off, repeat every 28 days., Disp: , Rfl:    simvastatin (ZOCOR) 40 MG tablet, Take 40 mg by mouth daily at 6 PM.  (Patient not taking: Reported on 01/09/2025), Disp: , Rfl:    Spacer/Aero-Holding Chambers (AEROCHAMBER MV) inhaler, Use as instructed (Patient not taking: Reported on 11/13/2024), Disp: 1 each, Rfl: 2 No current facility-administered medications for this visit.  Facility-Administered Medications  Ordered in Other Visits:    0.9 %  sodium chloride  infusion, , Intravenous, Continuous, Melanee Annah BROCKS, MD, Last Rate: 10 mL/hr at 01/09/25 1212, New Bag at 01/09/25 1212   zoledronic  acid (ZOMETA ) 3.3 mg in sodium chloride  0.9 % 100 mL IVPB, 3.3 mg, Intravenous, Q28 days, Melanee Annah BROCKS, MD, Last Rate: 416.5 mL/hr at 01/09/25 1217, 3.3 mg at 01/09/25 1217  "

## 2025-01-10 LAB — CANCER ANTIGEN 27.29: CA 27.29: 77.6 U/mL — ABNORMAL HIGH (ref 0.0–38.6)

## 2025-01-10 LAB — CANCER ANTIGEN 15-3: CA 15-3: 74.8 U/mL — ABNORMAL HIGH (ref 0.0–25.0)

## 2025-03-12 ENCOUNTER — Other Ambulatory Visit

## 2025-04-09 ENCOUNTER — Inpatient Hospital Stay

## 2025-04-09 ENCOUNTER — Inpatient Hospital Stay: Admitting: Oncology
